# Patient Record
Sex: Male | Born: 1955 | Race: White | Hispanic: No | Marital: Married | Smoking: Never smoker
Health system: Southern US, Community
[De-identification: ages and names within clinical notes are randomized; demographics above are authoritative.]

## PROBLEM LIST (undated history)

## (undated) DIAGNOSIS — R1310 Dysphagia, unspecified: Secondary | ICD-10-CM

## (undated) DIAGNOSIS — K219 Gastro-esophageal reflux disease without esophagitis: Secondary | ICD-10-CM

## (undated) DIAGNOSIS — I10 Essential (primary) hypertension: Secondary | ICD-10-CM

## (undated) DIAGNOSIS — S32010A Wedge compression fracture of first lumbar vertebra, initial encounter for closed fracture: Secondary | ICD-10-CM

## (undated) DIAGNOSIS — Z974 Presence of external hearing-aid: Secondary | ICD-10-CM

## (undated) DIAGNOSIS — G4730 Sleep apnea, unspecified: Secondary | ICD-10-CM

## (undated) DIAGNOSIS — N419 Inflammatory disease of prostate, unspecified: Secondary | ICD-10-CM

## (undated) DIAGNOSIS — R55 Syncope and collapse: Secondary | ICD-10-CM

## (undated) DIAGNOSIS — G20 Parkinson's disease: Secondary | ICD-10-CM

## (undated) DIAGNOSIS — G20A1 Parkinson's disease without dyskinesia, without mention of fluctuations: Secondary | ICD-10-CM

## (undated) DIAGNOSIS — E119 Type 2 diabetes mellitus without complications: Secondary | ICD-10-CM

## (undated) HISTORY — PX: ESOPHAGOGASTRODUODENOSCOPY: SHX1529

## (undated) HISTORY — DX: Inflammatory disease of prostate, unspecified: N41.9

## (undated) HISTORY — DX: Type 2 diabetes mellitus without complications: E11.9

## (undated) HISTORY — DX: Wedge compression fracture of first lumbar vertebra, initial encounter for closed fracture: S32.010A

## (undated) HISTORY — DX: Dysphagia, unspecified: R13.10

## (undated) HISTORY — PX: COLONOSCOPY: SHX174

## (undated) HISTORY — DX: Gastro-esophageal reflux disease without esophagitis: K21.9

## (undated) HISTORY — DX: Syncope and collapse: R55

---

## 2004-05-13 HISTORY — PX: CARDIAC CATHETERIZATION: SHX172

## 2006-03-16 ENCOUNTER — Inpatient Hospital Stay: Payer: Self-pay | Admitting: Unknown Physician Specialty

## 2006-03-19 ENCOUNTER — Emergency Department: Payer: Self-pay | Admitting: Emergency Medicine

## 2006-05-04 ENCOUNTER — Ambulatory Visit: Payer: Self-pay | Admitting: Physician Assistant

## 2008-07-17 ENCOUNTER — Other Ambulatory Visit: Payer: Self-pay

## 2008-07-17 ENCOUNTER — Emergency Department: Payer: Self-pay

## 2009-03-31 ENCOUNTER — Emergency Department: Payer: Self-pay | Admitting: Emergency Medicine

## 2010-01-14 ENCOUNTER — Ambulatory Visit: Payer: Self-pay | Admitting: Gastroenterology

## 2010-04-23 IMAGING — CR DG CHEST 2V
1 series · 2 of 2 positions shown · non-contrast
Comparison: none

REASON FOR EXAM: sob with near syncope
COMMENTS:

[Series 1: view not recorded · 0.17mm/px · 2 of 2 slices shown]
[im 1/2]
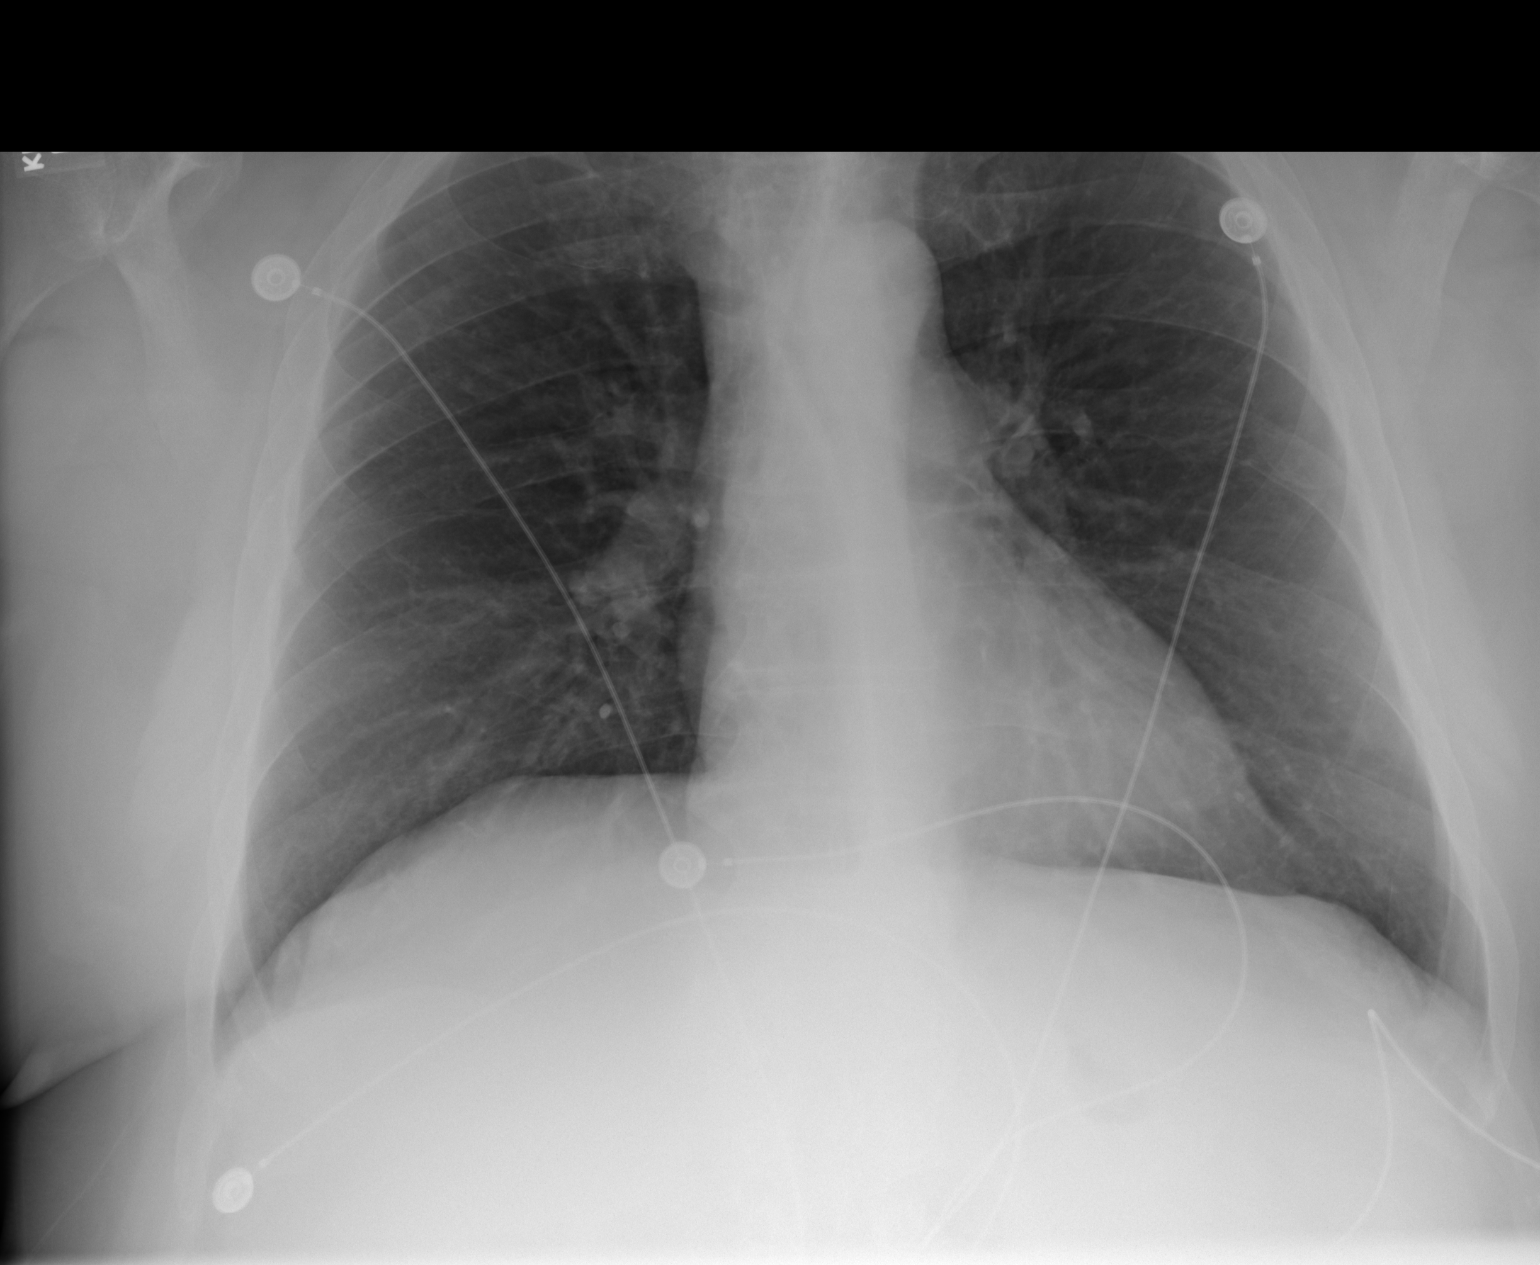
[im 2/2]
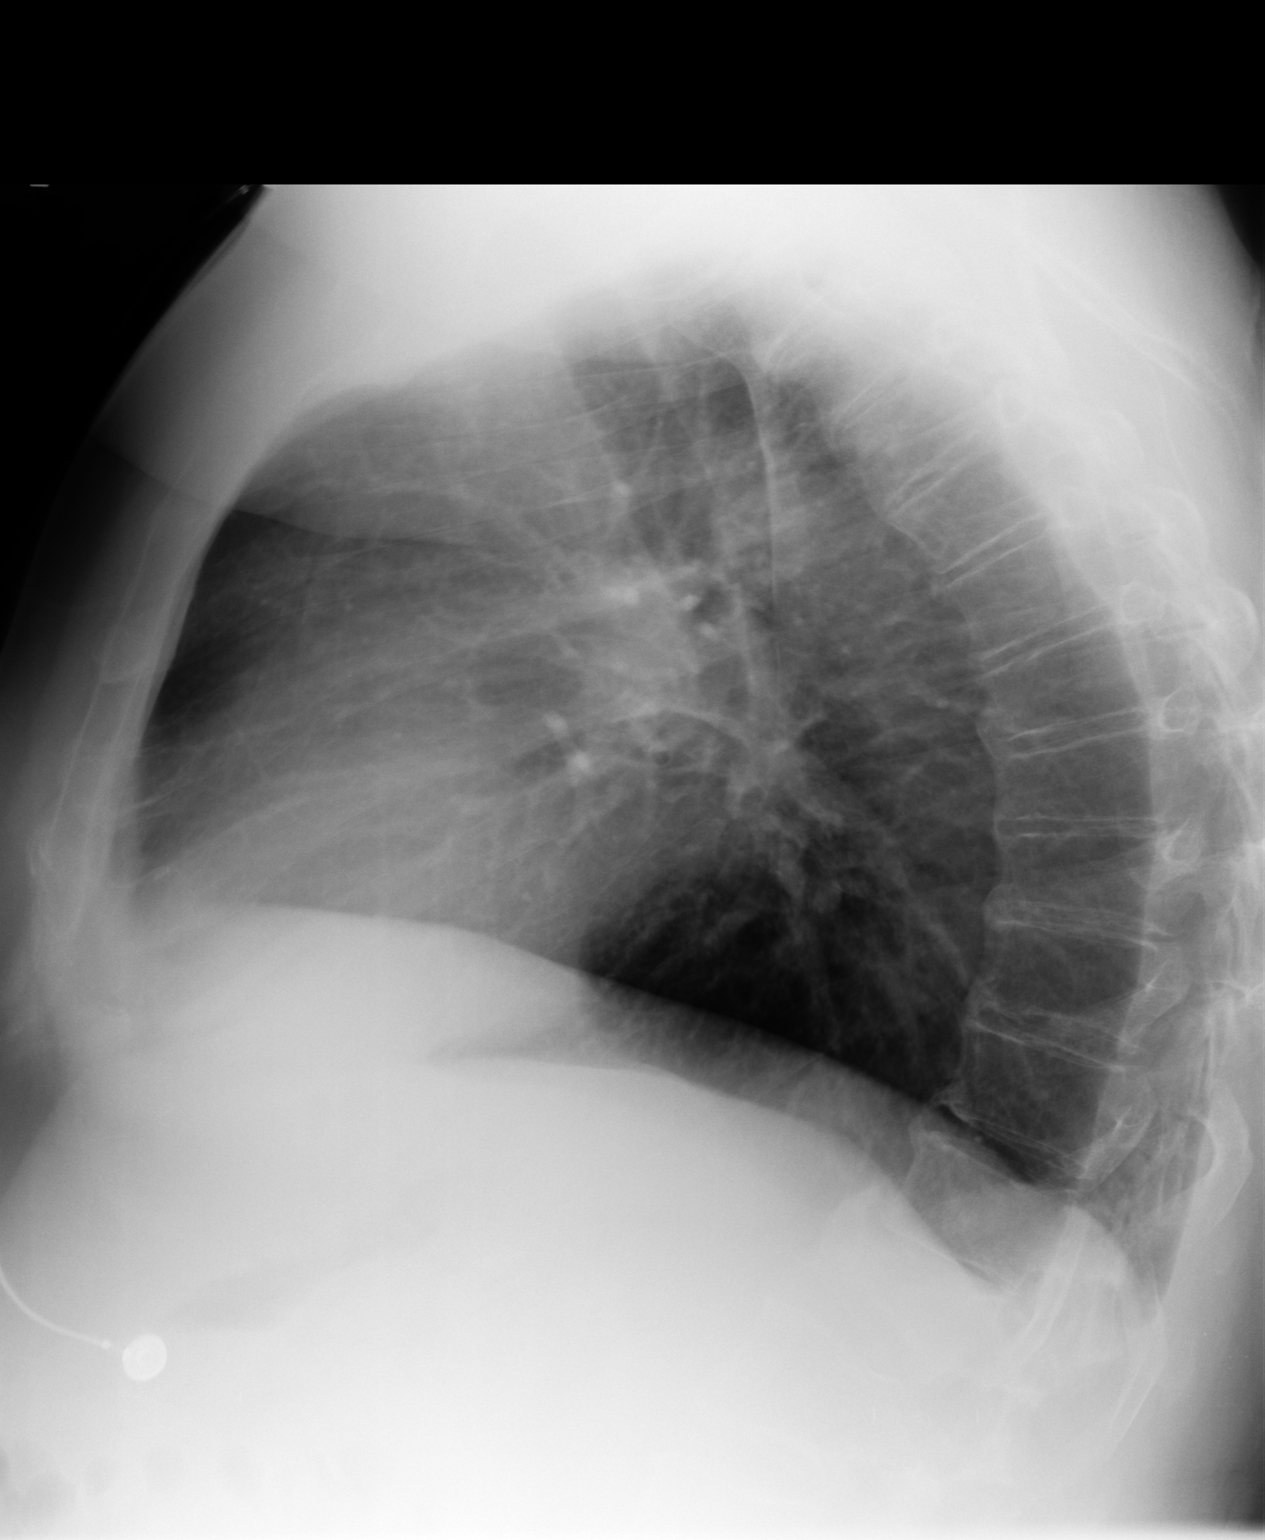

[2 of 2 positions shown; findings below may reference images not displayed]

PROCEDURE:     DXR - DXR CHEST PA (OR AP) AND LATERAL  - July 17, 2008  [DATE]

RESULT:     There is no previous exam for comparison. The lungs are
hyperinflated. Cardiac monitoring electrodes are present. The lungs are
clear. The heart and pulmonary vessels are normal. The bony and mediastinal
structures are unremarkable. There is no effusion. There is no pneumothorax
or evidence of congestive failure.
IMPRESSION: No acute cardiopulmonary disease. COPD changes are
suggested.

## 2013-03-22 ENCOUNTER — Encounter: Payer: Self-pay | Admitting: Orthopedic Surgery

## 2013-04-10 ENCOUNTER — Emergency Department: Payer: Self-pay | Admitting: Emergency Medicine

## 2013-04-10 LAB — COMPREHENSIVE METABOLIC PANEL
Alkaline Phosphatase: 62 U/L (ref 50–136)
Anion Gap: 6 — ABNORMAL LOW (ref 7–16)
BUN: 11 mg/dL (ref 7–18)
Bilirubin,Total: 0.9 mg/dL (ref 0.2–1.0)
Chloride: 110 mmol/L — ABNORMAL HIGH (ref 98–107)
Co2: 26 mmol/L (ref 21–32)
Glucose: 144 mg/dL — ABNORMAL HIGH (ref 65–99)
Osmolality: 285 (ref 275–301)
SGOT(AST): 18 U/L (ref 15–37)
SGPT (ALT): 40 U/L (ref 12–78)
Total Protein: 6.7 g/dL (ref 6.4–8.2)

## 2013-04-10 LAB — CBC
MCV: 82 fL (ref 80–100)
Platelet: 186 10*3/uL (ref 150–440)
RBC: 5.18 10*6/uL (ref 4.40–5.90)
RDW: 13.8 % (ref 11.5–14.5)
WBC: 10.6 10*3/uL (ref 3.8–10.6)

## 2013-04-10 LAB — TROPONIN I: Troponin-I: <0.02 ng/mL

## 2013-04-10 LAB — PRO B NATRIURETIC PEPTIDE: B-Type Natriuretic Peptide: 81 pg/mL (ref 0–125)

## 2013-04-10 LAB — CK TOTAL AND CKMB (NOT AT ARMC): CK, Total: 124 U/L (ref 35–232)

## 2013-04-11 ENCOUNTER — Encounter: Payer: Self-pay | Admitting: *Deleted

## 2013-04-11 ENCOUNTER — Other Ambulatory Visit: Payer: Self-pay | Admitting: *Deleted

## 2013-04-11 ENCOUNTER — Encounter: Payer: Self-pay | Admitting: Cardiovascular Disease

## 2013-04-11 ENCOUNTER — Ambulatory Visit (INDEPENDENT_AMBULATORY_CARE_PROVIDER_SITE_OTHER): Payer: No Typology Code available for payment source | Admitting: Cardiovascular Disease

## 2013-04-11 VITALS — BP 118/74 | HR 71 | Ht 71.0 in | Wt 234.0 lb

## 2013-04-11 DIAGNOSIS — R0602 Shortness of breath: Secondary | ICD-10-CM

## 2013-04-11 DIAGNOSIS — R0789 Other chest pain: Secondary | ICD-10-CM

## 2013-04-11 MED ORDER — OMEPRAZOLE 20 MG PO CPDR: 20.0000 mg | DELAYED_RELEASE_CAPSULE | Freq: Two times a day (BID) | ORAL | Status: AC

## 2013-04-11 NOTE — Assessment & Plan Note (Signed)
He has been having intermittent episodes of chest pain mostly at rest which did not worsen with physical activities. The symptoms are atypical for angina and will likely to be related to his known history of GERD. Nonetheless, he has risk factors for coronary artery disease including prolonged history of diabetes .  His physical exam is unremarkable and baseline EKG is normal. I recommend further risk stratification with a treadmill stress test which will be done today. If this comes back normal, he might require further GI evaluation.

## 2013-04-11 NOTE — Procedures (Signed)
    Treadmill Stress test  Indication: Atypical chest pain.  Baseline Data:  Resting EKG shows NSR with rate of 82 bpm, no significant ST or T wave changes. Frequent PACs noted. Lead misplacement was noted. Resting blood pressure of 118/74 mm Hg Stand bruce protocal was used.  Exercise Data:  Patient exercised for 8 min 30 sec,  Peak heart rate of 158 bpm.  This was 97 % of the maximum predicted heart rate. No symptoms of chest pain or lightheadedness were reported at peak stress or in recovery.  Peak Blood pressure recorded was 190/84 Maximal work level: 10.1 METs.  Heart rate at 3 minutes in recovery was 88 bpm. BP response: Hypertensive HR response: Normal.  EKG with Exercise: Sinus tachycardia with no significant ST changes. PACs noted in recovery.  FINAL IMPRESSION: Normal exercise stress test. No significant EKG changes concerning for ischemia. Good exercise tolerance.  Recommendation: His chest pain is likely noncardiac and most likely due to GERD. I advised him to followup with his primary care physician to see of referral to GI is indicated.

## 2013-04-11 NOTE — Patient Instructions (Addendum)
Your physician has requested that you have an exercise tolerance test. For further information please visit [email or web address]. Please also follow instruction sheet, as given.   

## 2013-04-11 NOTE — Patient Instructions (Addendum)
Your stress test is normal.  Change Omeprazole to 20 mg twice daily.  Follow up with Dr. Hyacinth Meeker.

## 2013-04-11 NOTE — Progress Notes (Signed)
Primary care physician: Bethann Punches, M.D.  HPI  This is a pleasant 57 year old male who is here today for evaluation of chest pain. He has no previous cardiac history. He underwent cardiac catheterization in 2005 after an abnormal stress test. Catheterization showed normal coronary arteries with normal LV systolic function. He has known history of type 2 diabetes and hyperlipidemia. He suffers from chronic back pain related to previous occupational injury. He does have known history of GERD with previous esophageal dilatation done by Dr. Bluford Kaufmann according to the patient. He has been having intermittent episodes of substernal chest tightness which happens at rest and does not worsen with physical activities. It lasts for a few minutes and usually it is mild. This worsened over the weekend and thus he went to the emergency room at [facility]. EKG showed no acute changes. His labs were normal including cardiac enzymes. Chest x-ray showed no acute abnormalities.  Allergies  Allergen Reactions  . Levaquin (Levofloxacin In D5w)      No current outpatient prescriptions on file prior to visit.   No current facility-administered medications on file prior to visit.     Past Medical History  Diagnosis Date  . GERD (gastroesophageal reflux disease)   . Prostatitis   . Compression fracture of L1 lumbar vertebra   . Dysphagia, unspecified(787.20)   . Syncope and collapse   . Diabetes mellitus without complication     Type II     Past Surgical History  Procedure Laterality Date  . Cardiac catheterization  05/2004    [facility]     History reviewed. No pertinent family history.   History   Social History  . Marital Status: Married    Spouse Name: N/A    Number of Children: N/A  . Years of Education: N/A   Occupational History  . Not on file.   Social History Main Topics  . Smoking status: Never Smoker   . Smokeless tobacco: Not on file  . Alcohol Use: No  . Drug Use: No     Comment: past    . Sexually Active: Not on file   Other Topics Concern  . Not on file   Social History Narrative  . No narrative on file     ROS Constitutional: Negative for fever, chills, diaphoresis, activity change, appetite change and fatigue.  HENT: Negative for hearing loss, nosebleeds, congestion, sore throat, facial swelling, drooling, trouble swallowing, neck pain, voice change, sinus pressure and tinnitus.  Eyes: Negative for photophobia, pain, discharge and visual disturbance.  Respiratory: Negative for apnea, cough,  shortness of breath and wheezing.  Cardiovascular: Negative for palpitations and leg swelling.  Gastrointestinal: Negative for nausea, vomiting, abdominal pain, diarrhea, constipation, blood in stool and abdominal distention.  Genitourinary: Negative for dysuria, urgency, frequency, hematuria and decreased urine volume.  Musculoskeletal: Negative for myalgias, back pain, joint swelling, arthralgias and gait problem.  Skin: Negative for color change, pallor, rash and wound.  Neurological: Negative for dizziness, tremors, seizures, syncope, speech difficulty, weakness, light-headedness, numbness and headaches.  Psychiatric/Behavioral: Negative for suicidal ideas, hallucinations, behavioral problems and agitation. The patient is not nervous/anxious.     PHYSICAL EXAM   BP 118/74  Pulse 71  Ht 5\' 11"  (1.803 m)  Wt 234 lb (106.142 kg)  BMI 32.65 kg/m2 Constitutional: He is oriented to person, place, and time. He appears well-developed and well-nourished. No distress.  HENT: No nasal discharge.  Head: Normocephalic and atraumatic.  Eyes: Pupils are equal and round. Right eye exhibits  no discharge. Left eye exhibits no discharge.  Neck: Normal range of motion. Neck supple. No JVD present. No thyromegaly present.  Cardiovascular: Normal rate, regular rhythm, normal heart sounds and. Exam reveals no gallop and no friction rub. No murmur heard.  Pulmonary/Chest: Effort normal  and breath sounds normal. No stridor. No respiratory distress. He has no wheezes. He has no rales. He exhibits no tenderness.  Abdominal: Soft. Bowel sounds are normal. He exhibits no distension. There is no tenderness. There is no rebound and no guarding.  Musculoskeletal: Normal range of motion. He exhibits no edema and no tenderness.  Neurological: He is alert and oriented to person, place, and time. Coordination normal.  Skin: Skin is warm and dry. No rash noted. He is not diaphoretic. No erythema. No pallor.  Psychiatric: He has a normal mood and affect. His behavior is normal. Judgment and thought content normal.       EKG: Sinus  Rhythm  -RSR(V1) -nondiagnostic.   PROBABLY NORMAL   ASSESSMENT AND PLAN

## 2013-04-12 ENCOUNTER — Encounter: Payer: Self-pay | Admitting: Orthopedic Surgery

## 2013-04-21 ENCOUNTER — Encounter: Payer: Self-pay | Admitting: *Deleted

## 2013-05-13 ENCOUNTER — Encounter: Payer: Self-pay | Admitting: Orthopedic Surgery

## 2013-06-13 ENCOUNTER — Encounter: Payer: Self-pay | Admitting: Orthopedic Surgery

## 2013-11-18 ENCOUNTER — Emergency Department: Payer: Self-pay | Admitting: Emergency Medicine

## 2014-02-13 ENCOUNTER — Encounter (INDEPENDENT_AMBULATORY_CARE_PROVIDER_SITE_OTHER): Payer: Self-pay

## 2014-02-13 ENCOUNTER — Encounter: Payer: Self-pay | Admitting: *Deleted

## 2014-02-13 ENCOUNTER — Encounter: Payer: Self-pay | Admitting: Cardiovascular Disease

## 2014-02-13 ENCOUNTER — Ambulatory Visit (INDEPENDENT_AMBULATORY_CARE_PROVIDER_SITE_OTHER): Payer: No Typology Code available for payment source | Admitting: Cardiovascular Disease

## 2014-02-13 VITALS — BP 128/72 | HR 74 | Ht 71.0 in | Wt 229.8 lb

## 2014-02-13 DIAGNOSIS — R0609 Other forms of dyspnea: Secondary | ICD-10-CM

## 2014-02-13 DIAGNOSIS — R0600 Dyspnea, unspecified: Secondary | ICD-10-CM | POA: Insufficient documentation

## 2014-02-13 DIAGNOSIS — R002 Palpitations: Secondary | ICD-10-CM

## 2014-02-13 DIAGNOSIS — R0989 Other specified symptoms and signs involving the circulatory and respiratory systems: Secondary | ICD-10-CM

## 2014-02-13 NOTE — Patient Instructions (Signed)
Your physician has recommended that you wear a holter monitor. Holter monitors are medical devices that record the heart's electrical activity. Doctors most often use these monitors to diagnose arrhythmias. Arrhythmias are problems with the speed or rhythm of the heartbeat. The monitor is a small, portable device. You can wear one while you do your normal daily activities. This is usually used to diagnose what is causing palpitations/syncope (passing out).   Your physician has requested that you have an echocardiogram. Echocardiography is a painless test that uses sound waves to create images of your heart. It provides your doctor with information about the size and shape of your heart and how well your heart's chambers and valves are working. This procedure takes approximately one hour. There are no restrictions for this procedure.  Your physician recommends that you schedule a follow-up appointment in:  As needed   

## 2014-02-13 NOTE — Progress Notes (Signed)
Primary care physician: Emily Filbert, M.D.  HPI  This is a pleasant 58 year old male who is here today for a follow up visit and evaluation of palpitations. He has no previous cardiac history. He was seen last year for atypical chest pain. He had cardiac catheterization in 2005 after an abnormal stress test. Catheterization showed normal coronary arteries with normal LV systolic function. He has known history of type 2 diabetes and hyperlipidemia. He suffers from chronic back pain related to previous occupational injury. He does have known history of GERD with previous esophageal dilatation done by Dr. Candace Cruise according to the patient. He had a treadmill stress test done last year which was normal. Chest pain was felt to be due to GERD.   He has been having palpitations mostly at night. He complains of increased fatigue. He had an episode of dull aching sensation in his chest about 10 days ago. Blood pressure was checked by EMS and it was 150/70. EKG showed no acute abnormalities. His wife reports that he snores loud with previous episodes of apnea noted.  Allergies  Allergen Reactions  . Levaquin [Levofloxacin In D5w]      Current Outpatient Prescriptions on File Prior to Visit  Medication Sig Dispense Refill  . ALPRAZolam (XANAX) 0.25 MG tablet Take 0.25 mg by mouth at bedtime as needed for sleep.      Marland Kitchen aspirin 81 MG tablet Take 81 mg by mouth daily.      . cyclobenzaprine (FLEXERIL) 10 MG tablet Take 10 mg by mouth 2 (two) times daily as needed for muscle spasms.      Marland Kitchen losartan (COZAAR) 50 MG tablet Take 50 mg by mouth daily.      . metFORMIN (GLUCOPHAGE) 500 MG tablet Take 500 mg by mouth 2 (two) times daily with a meal.       . omeprazole (PRILOSEC) 20 MG capsule Take 1 capsule (20 mg total) by mouth 2 (two) times daily.  60 capsule  3   No current facility-administered medications on file prior to visit.     Past Medical History  Diagnosis Date  . GERD (gastroesophageal reflux  disease)   . Prostatitis   . Compression fracture of L1 lumbar vertebra   . Dysphagia, unspecified(787.20)   . Syncope and collapse   . Diabetes mellitus without complication     Type II     Past Surgical History  Procedure Laterality Date  . Cardiac catheterization  05/2004    [facility]     Family History  Problem Relation Age of Onset  . Family history unknown: Yes     History   Social History  . Marital Status: Married    Spouse Name: N/A    Number of Children: N/A  . Years of Education: N/A   Occupational History  . Not on file.   Social History Main Topics  . Smoking status: Never Smoker   . Smokeless tobacco: Not on file  . Alcohol Use: No  . Drug Use: No     Comment: past  . Sexual Activity: Not on file   Other Topics Concern  . Not on file   Social History Narrative  . No narrative on file        PHYSICAL EXAM   BP 128/72  Pulse 74  Ht 5\' 11"  (1.803 m)  Wt 229 lb 12 oz (104.214 kg)  BMI 32.06 kg/m2 Constitutional: He is oriented to person, place, and time. He appears well-developed and well-nourished.  No distress.  HENT: No nasal discharge.  Head: Normocephalic and atraumatic.  Eyes: Pupils are equal and round. Right eye exhibits no discharge. Left eye exhibits no discharge.  Neck: Normal range of motion. Neck supple. No JVD present. No thyromegaly present.  Cardiovascular: Normal rate, regular rhythm, normal heart sounds and. Exam reveals no gallop and no friction rub. No murmur heard.  Pulmonary/Chest: Effort normal and breath sounds normal. No stridor. No respiratory distress. He has no wheezes. He has no rales. He exhibits no tenderness.  Abdominal: Soft. Bowel sounds are normal. He exhibits no distension. There is no tenderness. There is no rebound and no guarding.  Musculoskeletal: Normal range of motion. He exhibits no edema and no tenderness.  Neurological: He is alert and oriented to person, place, and time. Coordination normal.  Skin:  Skin is warm and dry. No rash noted. He is not diaphoretic. No erythema. No pallor.  Psychiatric: He has a normal mood and affect. His behavior is normal. Judgment and thought content normal.       EKG: Normal sinus rhythm     ASSESSMENT AND PLAN

## 2014-02-13 NOTE — Assessment & Plan Note (Signed)
He complains of fatigue and occasional shortness of breath. He did have a treadmill stress test last year which was normal. I recommend an echocardiogram to evaluate LV systolic function. He also has symptoms suggestive of sleep apnea. Evaluation with a sleep study might be helpful given current symptoms.

## 2014-02-13 NOTE — Assessment & Plan Note (Signed)
He is having palpitations mostly at night. This could be due to premature beats. He had labs done in October of last year which overall was unremarkable including normal thyroid function. I requested a 48-hour Holter monitor for evaluation.

## 2014-02-20 DIAGNOSIS — R002 Palpitations: Secondary | ICD-10-CM

## 2014-02-21 ENCOUNTER — Other Ambulatory Visit: Payer: Self-pay

## 2014-02-21 ENCOUNTER — Other Ambulatory Visit (INDEPENDENT_AMBULATORY_CARE_PROVIDER_SITE_OTHER): Payer: No Typology Code available for payment source

## 2014-02-21 DIAGNOSIS — R0600 Dyspnea, unspecified: Secondary | ICD-10-CM

## 2014-02-21 DIAGNOSIS — R0602 Shortness of breath: Secondary | ICD-10-CM

## 2014-02-21 DIAGNOSIS — R079 Chest pain, unspecified: Secondary | ICD-10-CM

## 2014-03-02 ENCOUNTER — Telehealth: Payer: Self-pay

## 2014-03-02 NOTE — Telephone Encounter (Signed)
Pt wife left vm wanting a call to talk about pt medications.  Left msg to return my call.

## 2014-03-03 ENCOUNTER — Telehealth: Payer: Self-pay | Admitting: *Deleted

## 2014-03-03 NOTE — Telephone Encounter (Signed)
Pt would like copy of holter and echo mailed to him.

## 2014-03-03 NOTE — Telephone Encounter (Signed)
Called patient to let him know that per Dr. Fletcher Anon "his holter showed normal sinus rhythm with rare PVC's and occasioal PAC's. It is not serious and can be treated with a small dose of toprol if symptomatic"  Patient verbalized understanding.

## 2014-03-08 ENCOUNTER — Other Ambulatory Visit: Payer: Self-pay

## 2014-03-08 ENCOUNTER — Ambulatory Visit (INDEPENDENT_AMBULATORY_CARE_PROVIDER_SITE_OTHER): Payer: No Typology Code available for payment source | Admitting: *Deleted

## 2014-03-08 ENCOUNTER — Encounter: Payer: Self-pay | Admitting: Cardiovascular Disease

## 2014-03-08 DIAGNOSIS — R002 Palpitations: Secondary | ICD-10-CM

## 2014-03-14 ENCOUNTER — Telehealth: Payer: Self-pay

## 2014-03-14 NOTE — Telephone Encounter (Signed)
Pt wife called and would like to discuss if pt needs an appt, states she received a message through my chart, stating he needed an appt, also would like to discuss monitor results. Please call.

## 2014-03-14 NOTE — Telephone Encounter (Signed)
Informed patient that he can follow up needed  Mailed holter report

## 2014-10-20 ENCOUNTER — Ambulatory Visit: Payer: Self-pay | Admitting: Internal Medicine

## 2015-01-15 IMAGING — CR DG CHEST 2V
1 series · 3 of 3 positions shown · non-contrast
Comparison: none

REASON FOR EXAM: chest pain
COMMENTS:   May transport without cardiac monitor

PROCEDURE:     DXR - DXR CHEST PA (OR AP) AND LATERAL  - April 10, 2013  [DATE]
RESULT:     Comparison: 03/31/2009, 07/17/2008

[Series 1: w chest pa · 0.14mm/px · 3 of 3 slices shown]
[im 1/3]
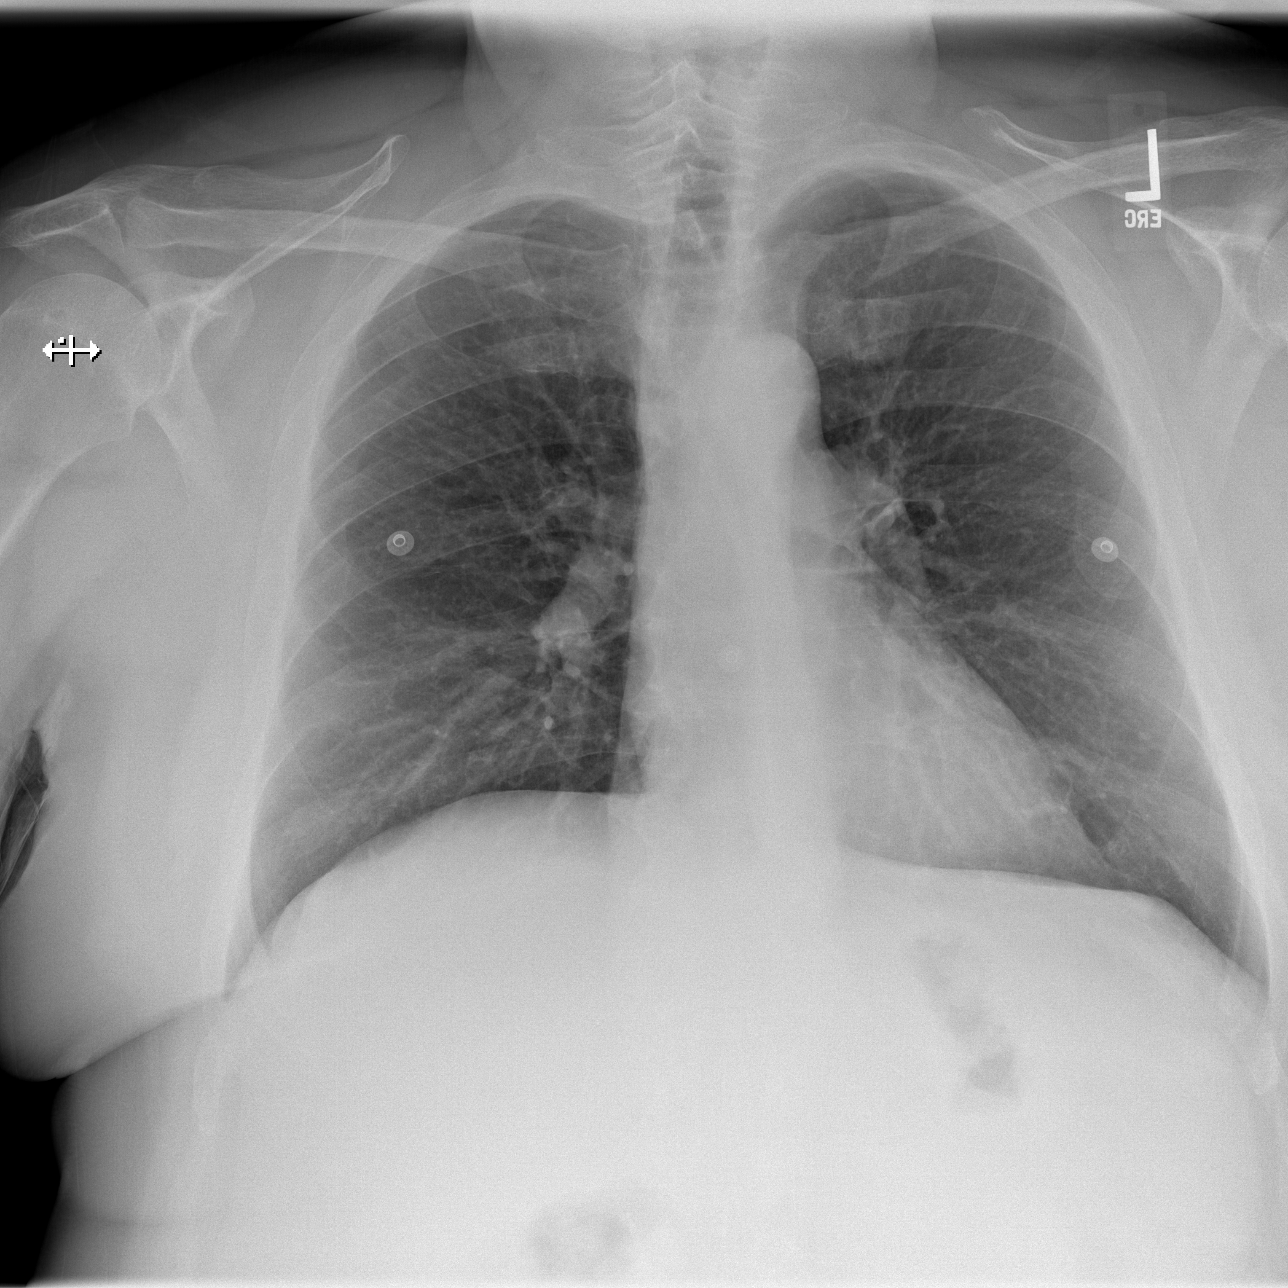
[im 2/3]
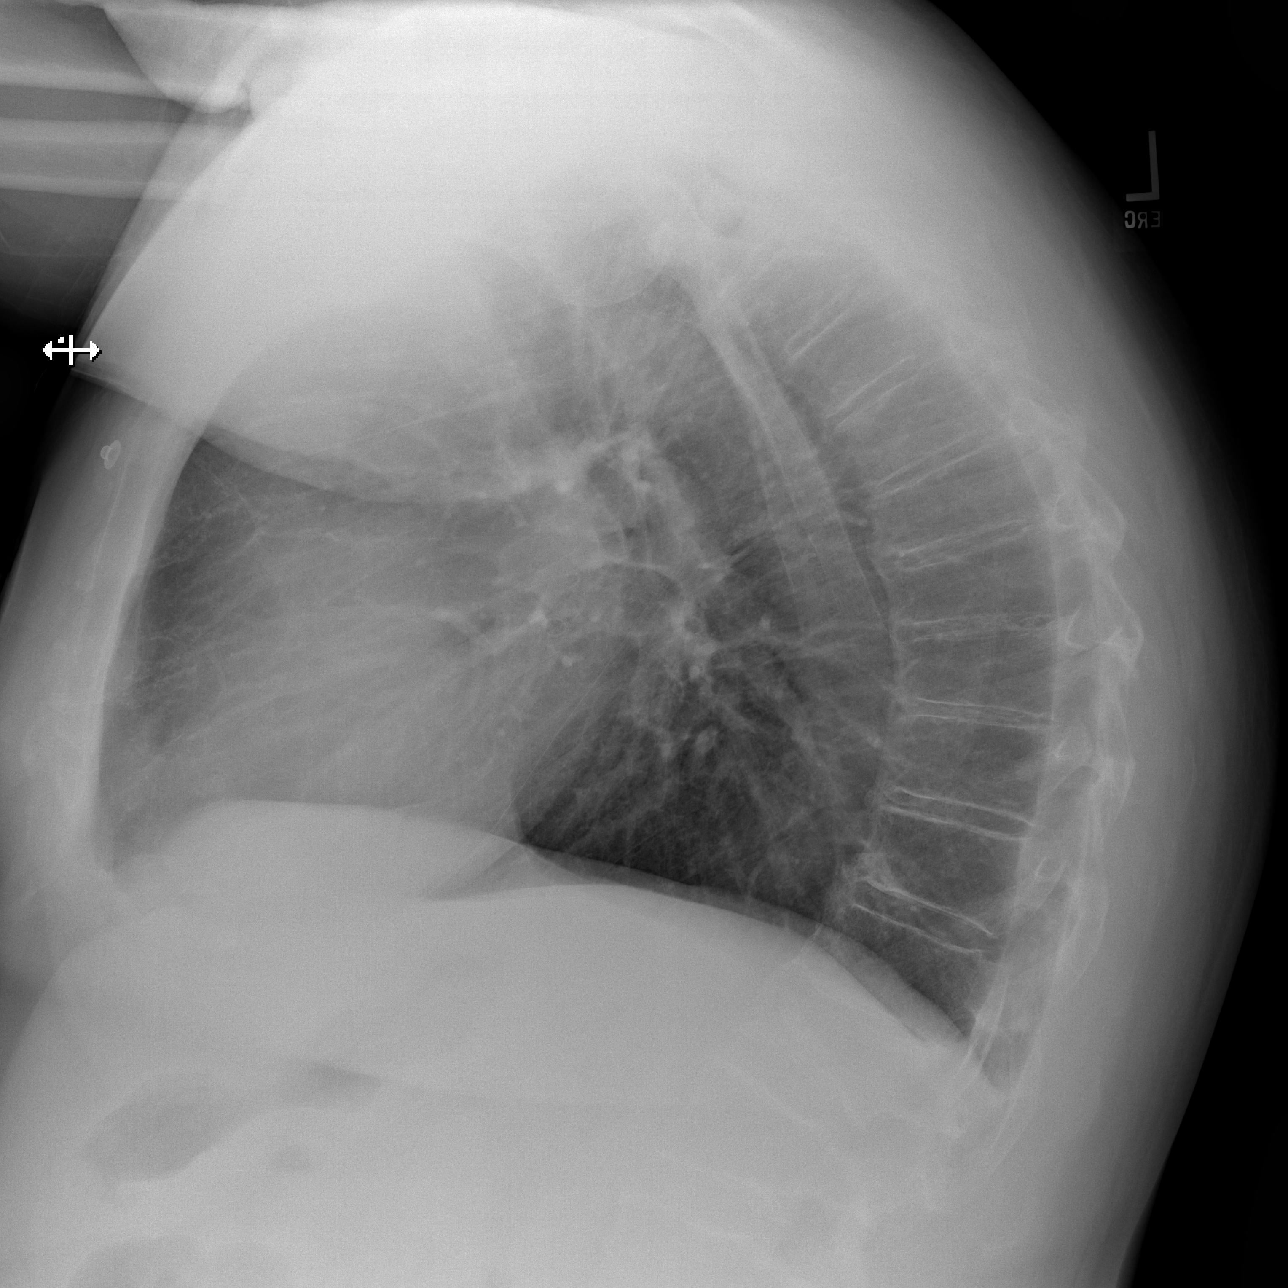
[im 3/3]
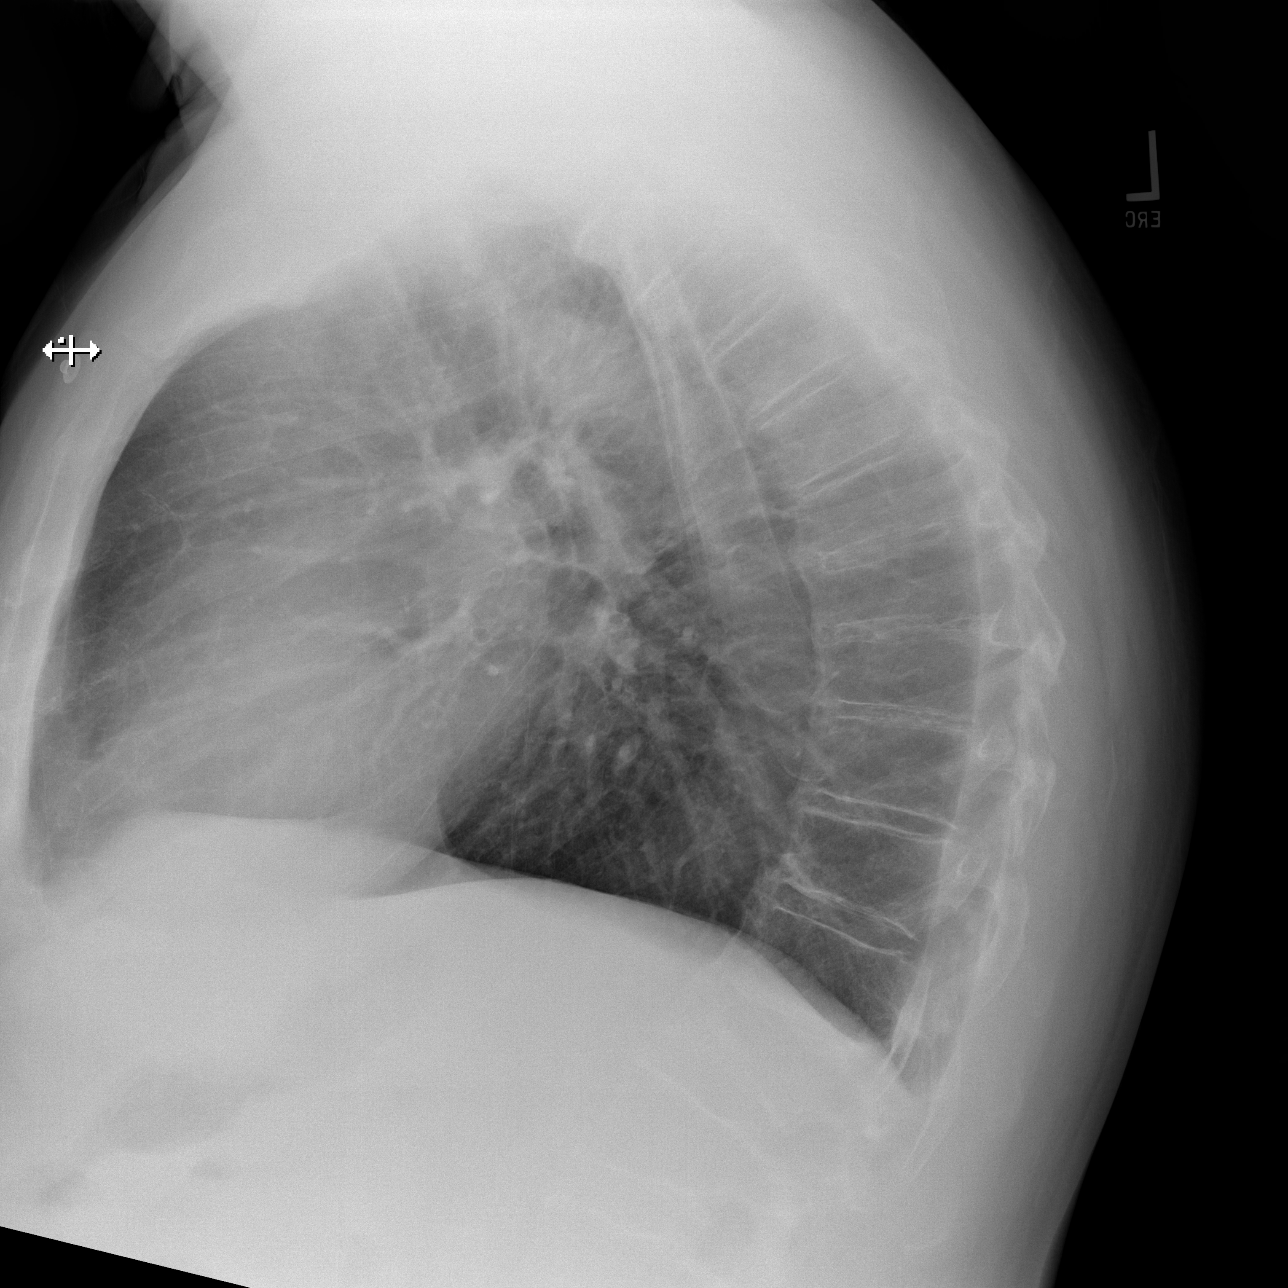

[3 of 3 positions shown; findings below may reference images not displayed]

FINDINGS: The heart and mediastinum are stable. No focal pulmonary opacities. There is
borderline hyperinflation. There is a moderate anterior wedge compression
deformity in the lower thoracic or upper lumbar spine, similar to prior.
IMPRESSION: No acute cardiopulmonary disease.

[REDACTED]

## 2015-08-25 IMAGING — CR DG LUMBAR SPINE 2-3V
1 series · 3 of 3 positions shown · non-contrast
Comparison: CT L SPINE W/O CM dated 03/16/2006

CLINICAL DATA: Back pain.  History of prior L1 fracture.

EXAM:
LUMBAR SPINE - 2-3 VIEW

[Series 2: t lumbar spine ap · 0.14mm/px · 3 of 3 slices shown]
[im 1/3]
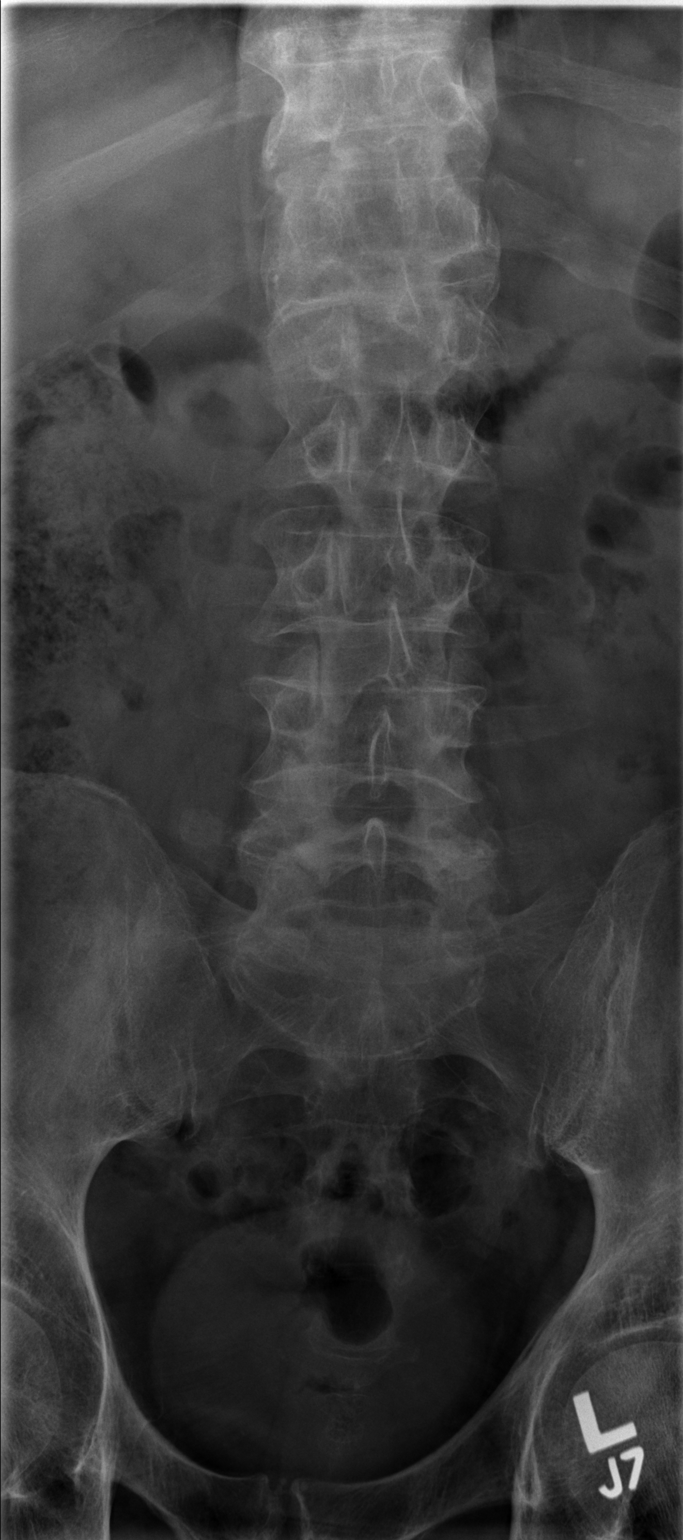
[im 2/3]
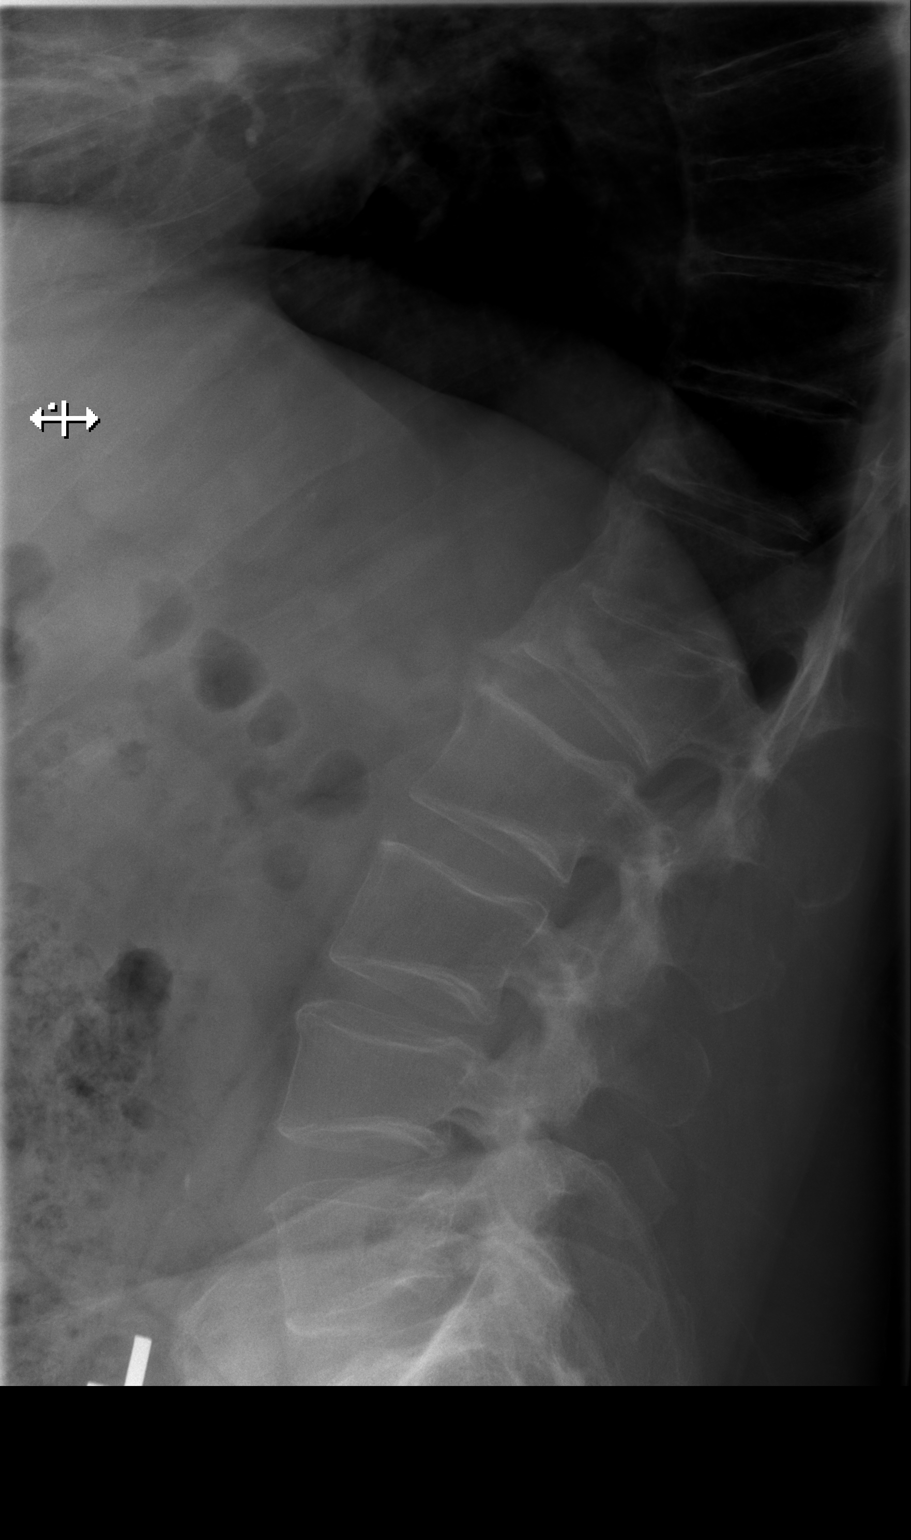
[im 3/3]
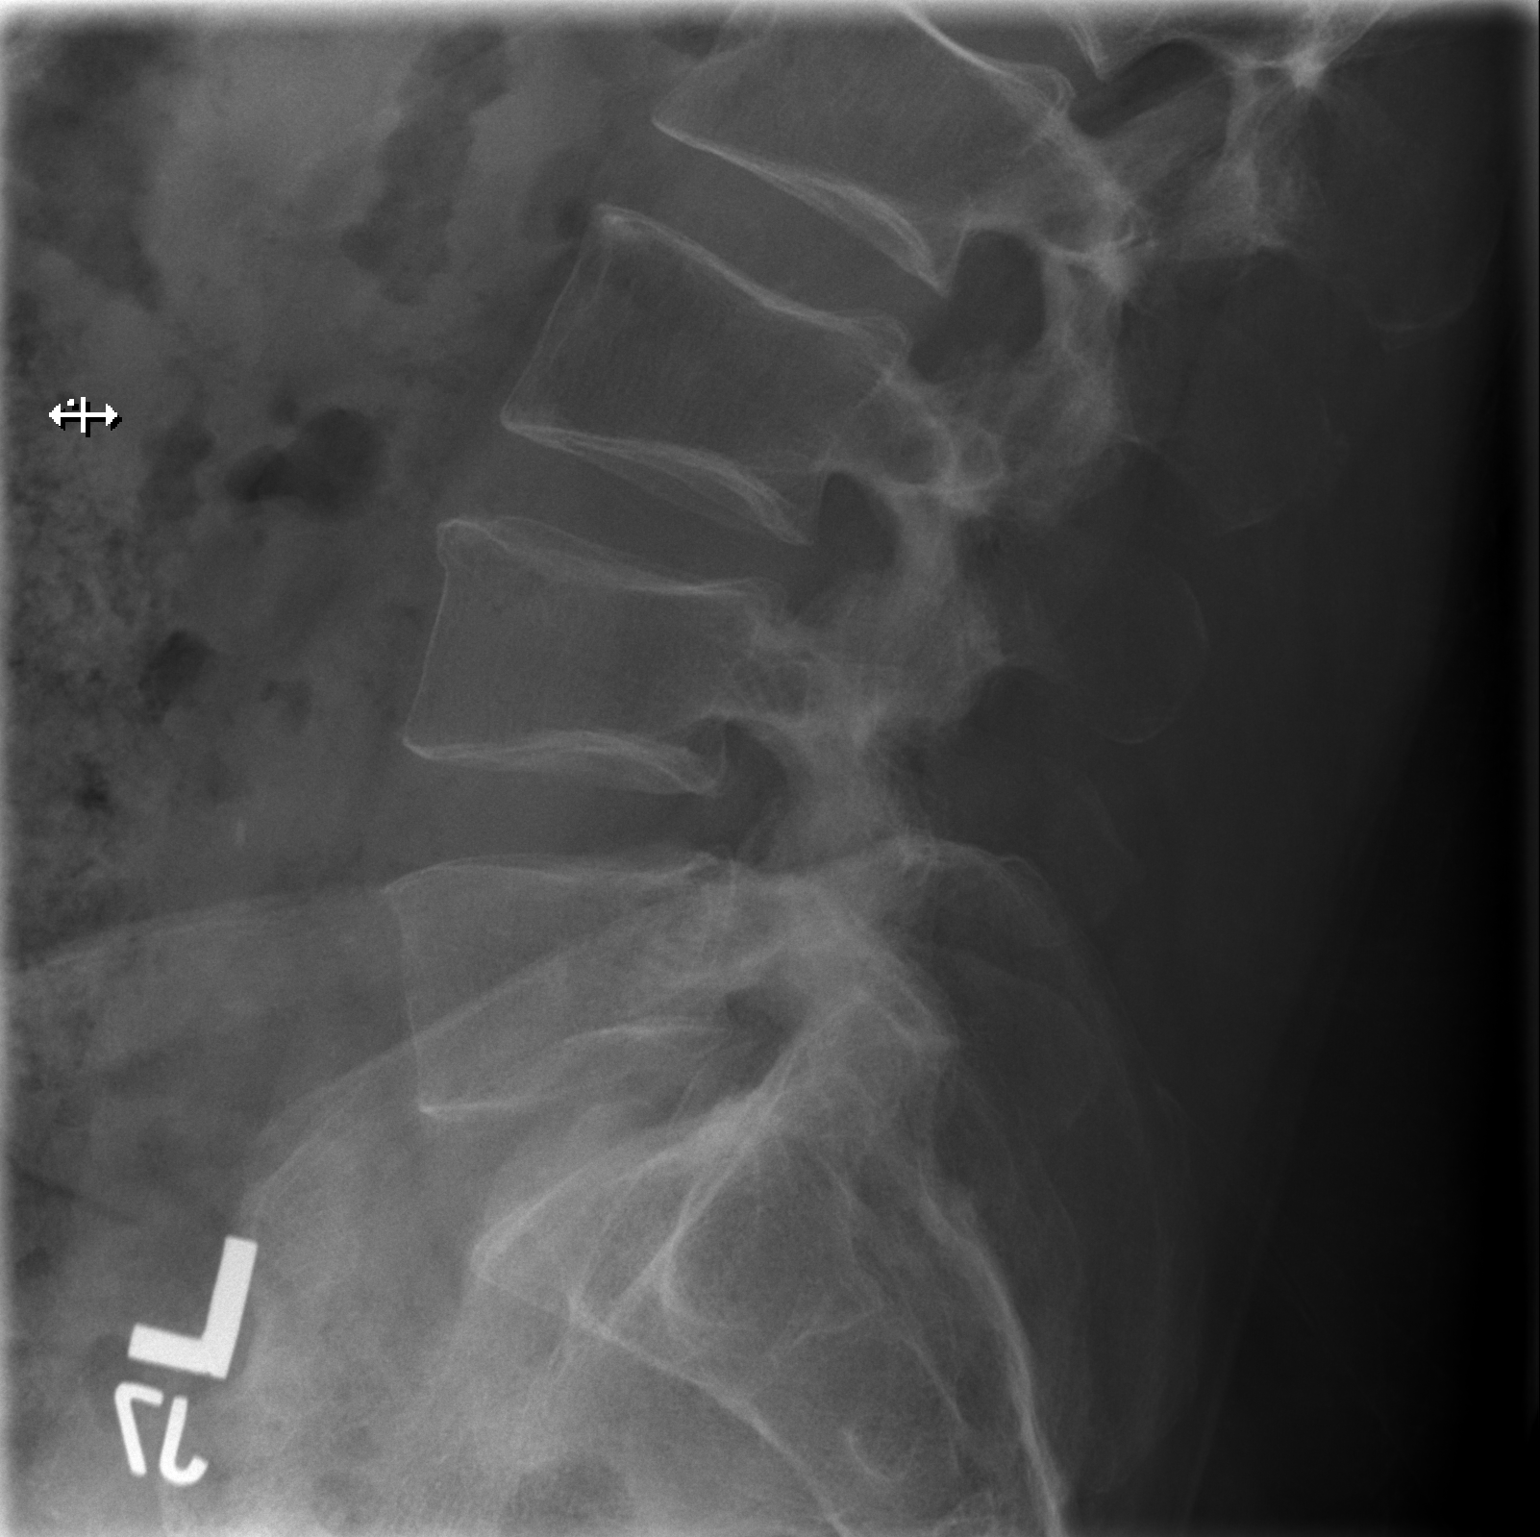

[3 of 3 positions shown; findings below may reference images not displayed]

FINDINGS: A chronic L1 compression fracture is appreciated. The bones are
osteopenic. There is not appear to be evidence of acute fracture or
dislocation. Endplate sclerosis is identified as well as mild disc
space narrowing at the L5-S1 level. Multilevel areas of central
endplate depression identified at throughout the mid and lower
lumbar spine.
IMPRESSION: Chronic L1 compression deformity by evidence of acute osseous
abnormalities. Spondylosis at the L5-S1 level.

## 2021-01-17 ENCOUNTER — Other Ambulatory Visit: Payer: Self-pay

## 2021-01-17 ENCOUNTER — Encounter: Payer: Self-pay | Admitting: Ophthalmology

## 2021-01-23 NOTE — Discharge Instructions (Signed)
INSTRUCTIONS FOLLOWING OCULOPLASTIC SURGERY AMY M. FOWLER, MD  AFTER YOUR EYE SURGERY, THER ARE MANY THINGS WHICH YOU, THE PATIENT, CAN DO TO ASSURE THE BEST POSSIBLE RESULT FROM YOUR OPERATION.  THIS SHEET SHOULD BE REFERRED TO WHENEVER QUESTIONS ARISE.  IF THERE ARE ANY QUESTIONS NOT ANSWERED HERE, DO NOT HESITATE TO CALL OUR OFFICE AT [phone] OR [phone].  THERE IS ALWAYS SOMEONE AVAILABLE TO CALL IF QUESTIONS OR PROBLEMS ARISE.  VISION: Your vision may be blurred and out of focus after surgery until you are able to stop using your ointment, swelling resolves and your eye(s) heal. This may take 1 to 2 weeks at the least.  If your vision becomes gradually more dim or dark, this is not normal and you need to call our office immediately.  EYE CARE: For the first 48 hours after surgery, use ice packs frequently - "20 minutes on, 20 minutes off" - to help reduce swelling and bruising.  Small bags of frozen peas or corn make good ice packs along with cloths soaked in ice water.  If you are wearing a patch or other type of dressing following surgery, keep this on for the amount of time specified by your doctor.  For the first week following surgery, you will need to treat your stitches with great care.  It is OK to shower, but take care to not allow soapy water to run into your eye(s) to help reduce chances of infection.  You may gently clean the eyelashes and around the eye(s) with cotton balls and sterile water, BUT DO NOT RUB THE STITCHES VIGOROUSLY.  Keeping your stitches moist with ointment will help promote healing with minimal scar formation.  ACTIVITY: When you leave the surgery center, you should go home, rest and be inactive.  The eye(s) may feel scratchy and keeping the eyes closed will allow for faster healing.  The first week following surgery, avoid straining (anything making the face turn red) or lifting over 20 pounds.  Additionally, avoid bending which causes your head to go below  your waist.  Using your eyes will NOT harm them, so feel free to read, watch television, use the computer, etc as desired.  Driving depends on each individual, so check with your doctor if you have questions about driving. Do not wear contact lenses for about 2 weeks.  Do not wear eye makeup for 2 weeks.  Avoid swimming, hot tubs, gardening, and dusting for 1 to 2 weeks to reduce the risk of an infection.  MEDICATIONS:  You will be given a prescription for an ointment to use 4 times a day on your stitches.  You can use the ointment in your eyes if they feel scratchy or irritated.  If you eyelid(s) don't close completely when you sleep, put some ointment in your eyes before bedtime.  EMERGENCY: If you experience SEVERE EYE PAIN OR HEADACHE UNRELIEVED BY TYLENOL OR TRAMADOL, NAUSEA OR VOMITING, WORSENING REDNESS, OR WORSENING VISION (ESPECIALLY VISION THAT WAS INITIALLY BETTER) CALL [phone] OR [phone] DURING BUSINESS HOURS OR AFTER HOURS. General Anesthesia, Adult, Care After This sheet gives you information about how to care for yourself after your procedure. Your health care provider may also give you more specific instructions. If you have problems or questions, contact your health care provider. What can I expect after the procedure? After the procedure, the following side effects are common:  Pain or discomfort at the IV site.  Nausea.  Vomiting.  Sore throat.  Trouble concentrating.  Feeling   cold or chills.  Feeling weak or tired.  Sleepiness and fatigue.  Soreness and body aches. These side effects can affect parts of the body that were not involved in surgery. Follow these instructions at home: For the time period you were told by your health care provider:  Rest.  Do not participate in activities where you could fall or become injured.  Do not drive or use machinery.  Do not drink alcohol.  Do not take sleeping pills or medicines that cause drowsiness.  Do  not make important decisions or sign legal documents.  Do not take care of children on your own.   Eating and drinking  Follow any instructions from your health care provider about eating or drinking restrictions.  When you feel hungry, start by eating small amounts of foods that are soft and easy to digest (bland), such as toast. Gradually return to your regular diet.  Drink enough fluid to keep your urine pale yellow.  If you vomit, rehydrate by drinking water, juice, or clear broth. General instructions  If you have sleep apnea, surgery and certain medicines can increase your risk for breathing problems. Follow instructions from your health care provider about wearing your sleep device: ? Anytime you are sleeping, including during daytime naps. ? While taking prescription pain medicines, sleeping medicines, or medicines that make you drowsy.  Have a responsible adult stay with you for the time you are told. It is important to have someone help care for you until you are awake and alert.  Return to your normal activities as told by your health care provider. Ask your health care provider what activities are safe for you.  Take over-the-counter and prescription medicines only as told by your health care provider.  If you smoke, do not smoke without supervision.  Keep all follow-up visits as told by your health care provider. This is important. Contact a health care provider if:  You have nausea or vomiting that does not get better with medicine.  You cannot eat or drink without vomiting.  You have pain that does not get better with medicine.  You are unable to pass urine.  You develop a skin rash.  You have a fever.  You have redness around your IV site that gets worse. Get help right away if:  You have difficulty breathing.  You have chest pain.  You have blood in your urine or stool, or you vomit blood. Summary  After the procedure, it is common to have a sore  throat or nausea. It is also common to feel tired.  Have a responsible adult stay with you for the time you are told. It is important to have someone help care for you until you are awake and alert.  When you feel hungry, start by eating small amounts of foods that are soft and easy to digest (bland), such as toast. Gradually return to your regular diet.  Drink enough fluid to keep your urine pale yellow.  Return to your normal activities as told by your health care provider. Ask your health care provider what activities are safe for you. This information is not intended to replace advice given to you by your health care provider. Make sure you discuss any questions you have with your health care provider. Document Revised: 06/14/2020 Document Reviewed: 01/12/2020 Elsevier Patient Education  2021 [employer]  

## 2021-01-25 ENCOUNTER — Encounter: Payer: Self-pay | Admitting: Ophthalmology

## 2021-01-25 ENCOUNTER — Ambulatory Visit: Payer: Medicare PPO | Admitting: Anesthesiology

## 2021-01-25 ENCOUNTER — Encounter
Admission: RE | Disposition: A | Discharge status: Home / Self Care | Payer: Self-pay | Source: Home / Self Care | Attending: Ophthalmology

## 2021-01-25 ENCOUNTER — Other Ambulatory Visit: Payer: Self-pay

## 2021-01-25 ENCOUNTER — Ambulatory Visit
Admission: RE | Admit: 2021-01-25 | Discharge: 2021-01-25 | Disposition: A | Discharge status: Home / Self Care | Payer: Medicare PPO | Attending: Ophthalmology | Admitting: Ophthalmology

## 2021-01-25 DIAGNOSIS — H57813 Brow ptosis, bilateral: Secondary | ICD-10-CM | POA: Insufficient documentation

## 2021-01-25 DIAGNOSIS — Z791 Long term (current) use of non-steroidal anti-inflammatories (NSAID): Secondary | ICD-10-CM | POA: Insufficient documentation

## 2021-01-25 DIAGNOSIS — E119 Type 2 diabetes mellitus without complications: Secondary | ICD-10-CM | POA: Diagnosis not present

## 2021-01-25 DIAGNOSIS — Z881 Allergy status to other antibiotic agents status: Secondary | ICD-10-CM | POA: Diagnosis not present

## 2021-01-25 DIAGNOSIS — Z7984 Long term (current) use of oral hypoglycemic drugs: Secondary | ICD-10-CM | POA: Insufficient documentation

## 2021-01-25 DIAGNOSIS — Z79899 Other long term (current) drug therapy: Secondary | ICD-10-CM | POA: Diagnosis not present

## 2021-01-25 DIAGNOSIS — H02831 Dermatochalasis of right upper eyelid: Secondary | ICD-10-CM | POA: Insufficient documentation

## 2021-01-25 DIAGNOSIS — H02834 Dermatochalasis of left upper eyelid: Secondary | ICD-10-CM | POA: Diagnosis not present

## 2021-01-25 DIAGNOSIS — L574 Cutis laxa senilis: Secondary | ICD-10-CM | POA: Insufficient documentation

## 2021-01-25 HISTORY — DX: Parkinson's disease: G20

## 2021-01-25 HISTORY — DX: Sleep apnea, unspecified: G47.30

## 2021-01-25 HISTORY — DX: Presence of external hearing-aid: Z97.4

## 2021-01-25 HISTORY — PX: BROW LIFT: SHX178

## 2021-01-25 HISTORY — DX: Essential (primary) hypertension: I10

## 2021-01-25 HISTORY — DX: Parkinson's disease without dyskinesia, without mention of fluctuations: G20.A1

## 2021-01-25 LAB — GLUCOSE, CAPILLARY: Glucose-Capillary: 141 mg/dL — ABNORMAL HIGH (ref 70–99)

## 2021-01-25 SURGERY — BLEPHAROPLASTY
Anesthesia: General | Site: Eye | Laterality: Bilateral

## 2021-01-25 MED ORDER — LACTATED RINGERS IV SOLN: INTRAVENOUS | Status: DC

## 2021-01-25 MED ORDER — ERYTHROMYCIN 5 MG/GM OP OINT: TOPICAL_OINTMENT | OPHTHALMIC | 2 refills | Status: AC

## 2021-01-25 MED ORDER — PROPOFOL 500 MG/50ML IV EMUL
INTRAVENOUS | Status: DC | PRN
  Administered 2021-01-25: 50 ug/kg/min via INTRAVENOUS

## 2021-01-25 MED ORDER — LIDOCAINE-EPINEPHRINE 2 %-1:100000 IJ SOLN
INTRAMUSCULAR | Status: DC | PRN
  Administered 2021-01-25: 9 mL via OPHTHALMIC

## 2021-01-25 MED ORDER — ERYTHROMYCIN 5 MG/GM OP OINT
TOPICAL_OINTMENT | OPHTHALMIC | Status: DC | PRN
  Administered 2021-01-25: 1 via OPHTHALMIC

## 2021-01-25 MED ORDER — TETRACAINE HCL 0.5 % OP SOLN
OPHTHALMIC | Status: DC | PRN
  Administered 2021-01-25: 2 [drp] via OPHTHALMIC

## 2021-01-25 MED ORDER — BSS IO SOLN
INTRAOCULAR | Status: DC | PRN
  Administered 2021-01-25: 15 mL via INTRAOCULAR

## 2021-01-25 MED ORDER — TRAMADOL HCL 50 MG PO TABS: ORAL_TABLET | ORAL | 0 refills | Status: AC

## 2021-01-25 MED ORDER — MIDAZOLAM HCL 2 MG/2ML IJ SOLN
INTRAMUSCULAR | Status: DC | PRN
  Administered 2021-01-25 (×2): 1 mg via INTRAVENOUS

## 2021-01-25 MED ORDER — ALFENTANIL 500 MCG/ML IJ INJ
INJECTION | INTRAVENOUS | Status: DC | PRN
  Administered 2021-01-25: 250 ug via INTRAVENOUS
  Administered 2021-01-25: 500 ug via INTRAVENOUS
  Administered 2021-01-25: 250 ug via INTRAVENOUS

## 2021-01-25 MED ORDER — STERILE WATER FOR IRRIGATION IR SOLN
Status: DC | PRN
  Administered 2021-01-25: 500 mL

## 2021-01-25 SURGICAL SUPPLY — 35 items
APPLICATOR COTTON TIP WD 3 STR (MISCELLANEOUS) ×2 IMPLANT
BLADE SURG 15 STRL LF DISP TIS (BLADE) ×2 IMPLANT
BLADE SURG 15 STRL SS (BLADE) ×4
CORD BIP STRL DISP 12FT (MISCELLANEOUS) ×2 IMPLANT
GAUZE SPONGE 4X4 12PLY STRL (GAUZE/BANDAGES/DRESSINGS) ×2 IMPLANT
GLOVE SURG LX 7.0 MICRO (GLOVE) ×2
GLOVE SURG LX STRL 7.0 MICRO (GLOVE) ×2 IMPLANT
GOWN STRL REUS W/ TWL LRG LVL3 (GOWN DISPOSABLE) ×1 IMPLANT
GOWN STRL REUS W/TWL LRG LVL3 (GOWN DISPOSABLE) ×2
MARKER SKIN XFINE TIP W/RULER (MISCELLANEOUS) ×2 IMPLANT
NEEDLE FILTER BLUNT 18X 1/2SAF (NEEDLE) ×1
NEEDLE FILTER BLUNT 18X1 1/2 (NEEDLE) ×1 IMPLANT
NEEDLE HYPO 30X.5 LL (NEEDLE) ×4 IMPLANT
PACK ENT CUSTOM (PACKS) ×2 IMPLANT
SOL PREP PVP 2OZ (MISCELLANEOUS) ×2
SOLUTION PREP PVP 2OZ (MISCELLANEOUS) ×1 IMPLANT
SPONGE GAUZE 2X2 8PLY STRL LF (GAUZE/BANDAGES/DRESSINGS) ×20 IMPLANT
SUT CHROMIC 4-0 (SUTURE)
SUT CHROMIC 4-0 M2 12X2 ARM (SUTURE)
SUT CHROMIC 5 0 P 3 (SUTURE) ×4 IMPLANT
SUT ETHILON 4 0 CL P 3 (SUTURE) IMPLANT
SUT GUT PLAIN 6-0 1X18 ABS (SUTURE) ×2 IMPLANT
SUT MERSILENE 4-0 S-2 (SUTURE) IMPLANT
SUT PROLENE 5 0 P 3 (SUTURE) ×4 IMPLANT
SUT PROLENE 6 0 P 1 18 (SUTURE) IMPLANT
SUT SILK 4 0 G 3 (SUTURE) IMPLANT
SUT VIC AB 5-0 P-3 18X BRD (SUTURE) IMPLANT
SUT VIC AB 5-0 P3 18 (SUTURE)
SUT VICRYL 6-0  S14 CTD (SUTURE)
SUT VICRYL 6-0 S14 CTD (SUTURE) IMPLANT
SUT VICRYL 7 0 TG140 8 (SUTURE) IMPLANT
SUTURE CHRMC 4-0 M2 12X2 ARM (SUTURE) IMPLANT
SYR 10ML LL (SYRINGE) ×2 IMPLANT
SYR 3ML LL SCALE MARK (SYRINGE) ×2 IMPLANT
WATER STERILE IRR 250ML POUR (IV SOLUTION) ×2 IMPLANT

## 2021-01-25 NOTE — Anesthesia Preprocedure Evaluation (Signed)
Anesthesia Evaluation  Patient identified by MRN, date of birth, ID band Patient awake    Airway Mallampati: I  TM Distance: >3 FB Neck ROM: Full    Dental no notable dental hx.    Pulmonary sleep apnea and Continuous Positive Airway Pressure Ventilation ,    Pulmonary exam normal        Cardiovascular Exercise Tolerance: Good hypertension, Normal cardiovascular exam     Neuro/Psych  Neuromuscular disease (Parkinson's disease, tremor and rigidity are main symptoms, no memory impairment)    GI/Hepatic GERD  ,  Endo/Other  diabetes, Type 2  Renal/GU      Musculoskeletal   Abdominal   Peds  Hematology negative hematology ROS (+)   Anesthesia Other Findings   Reproductive/Obstetrics                             Anesthesia Physical Anesthesia Plan  ASA: III  Anesthesia Plan: General   Post-op Pain Management:    Induction:   PONV Risk Score and Plan: 2 and Treatment may vary due to age or medical condition  Airway Management Planned: Nasal Cannula and Natural Airway  Additional Equipment: None  Intra-op Plan:   Post-operative Plan:   Informed Consent: I have reviewed the patients History and Physical, chart, labs and discussed the procedure including the risks, benefits and alternatives for the proposed anesthesia with the patient or authorized representative who has indicated his/her understanding and acceptance.       Plan Discussed with: CRNA  Anesthesia Plan Comments: (Brief unawareness during local anesthetic administration followed by MAC)        Anesthesia Quick Evaluation

## 2021-01-25 NOTE — Anesthesia Postprocedure Evaluation (Signed)
Anesthesia Post Note  Patient: John Mccall  Procedure(s) Performed: BLEPHAROPLASTY UPPER EYELID; W/EXCESS SKIN BROW PTOSIS REPAIR BILATERAL DIABETIC (Bilateral Eye)     Patient location during evaluation: PACU Anesthesia Type: General Level of consciousness: awake and alert Pain management: pain level controlled Vital Signs Assessment: post-procedure vital signs reviewed and stable Respiratory status: spontaneous breathing, nonlabored ventilation, respiratory function stable and patient connected to nasal cannula oxygen Cardiovascular status: blood pressure returned to baseline and stable Postop Assessment: no apparent nausea or vomiting Anesthetic complications: no   No complications documented.  Adele Barthel Maxxon Schwanke

## 2021-01-25 NOTE — Op Note (Signed)
Preoperative Diagnosis:   1.  Visually significant bilateral brow ptosis.  2.  Visually significant dermatochalasis both Upper Eyelid(s)  Postoperative Diagnosis: Same.   Procedure(s) Performed:   1. bilateral  Direct brow lift to improve vision.  2.  Upper eyelid blepharoplasty with excess skin excision bilateral  Upper Eyelid(s)  Surgeon: Philis Pique. [name], M.D.   Assistants: None   Anesthesia: MAC  Specimens: None.  Estimated Blood Loss: Minimal.  Complications: None.  Operative Findings: None Dictated  PROCEDURE:   Allergies were reviewed and the patient is allergic to levaquin [levofloxacin in d5w]..   After the risks, benefits, complications and alternatives were discussed with the patient, appropriate informed consent was obtained. While seated in an upright position and looking in primary gaze, the amount of supra-brow skin to be removed was measured and marked in an elliptical pattern. The patient was then brought to the operating suite and reclined supine.  Timeout was conducted and the patient was sedated. Local anesthetic consisting of a 50-50 mixture of 2% lidocaine with epinephrine and 0.75% bupivacaine with added Hylenex was injected subcutaneously to the bilateral  brow region(s) and down to the periosteum and  subcutaneously to both  upper eyelid(s). After adequate local was instilled, the patient was prepped and draped in the usual sterile fashion for eyelid surgery.   Attention was turned to the right brow region. A #15 blade was used to create a bevelled incision along the premarked incision line. A skin and subcutaneous tissue flap was then excised and hemostasis was obtained with bipolar cautery. The deep tissues were reapproximated with interrupted vertical 5-0 chromic sutures. The skin margin was reapproximated with a running locking 5-0 Prolene suture. Attention was then turned to the opposite brow region where the same procedure was performed in the same manner.     Attention was then turned to the upper eyelids. A 74m upper eyelid crease incision line was marked with calipers on both  upper eyelid(s).  A pinch test was used to estimate the amount of excess skin to remove and this was marked in standard blepharoplasty style fashion. Attention was turned to the right  upper eyelid. A #15 blade was used to open the premarked incision line. A Skin and muscle flap was excised and hemostasis was obtained with bipolar cautery.   Attention was then turned to the opposite eyelid where the same procedure was performed in the same manner.   The skin incisions were closed with a combination of interrupted and  running 6-0 fast absorbing plain gut suture.   The patient tolerated the procedure well. Erythromycin ophthalmic ointment was applied to the incision site(s) followed by ice packs.The patient was taken to the recovery area where he recovered without difficulty.  Post-Op Plan/Instructions:  The patient was instructed to use ice packs frequently for the next 48 hours.  he was instructed to use Erythromycin ophthalmic ophthalmic ointment on the eyelid incisions and over-the-counter antibiotic ointment on the brow sutures 4 times a day for the next 12 to 14 days. he was given a prescription for Tramadol (or similar) for pain control should Tylenol not be effective. he was asked to to follow up in 10-12 days time for suture removal or sooner as needed for problems.   Ellen Goris M. FVickki Muff M.D. Ophthalmology

## 2021-01-25 NOTE — Transfer of Care (Signed)
Immediate Anesthesia Transfer of Care Note  Patient: John Mccall  Procedure(s) Performed: BLEPHAROPLASTY UPPER EYELID; W/EXCESS SKIN BROW PTOSIS REPAIR BILATERAL DIABETIC (Bilateral Eye)  Patient Location: PACU  Anesthesia Type: General  Level of Consciousness: awake, alert  and patient cooperative  Airway and Oxygen Therapy: Patient Spontanous Breathing and Patient connected to supplemental oxygen  Post-op Assessment: Post-op Vital signs reviewed, Patient's Cardiovascular Status Stable, Respiratory Function Stable, Patent Airway and No signs of Nausea or vomiting  Post-op Vital Signs: Reviewed and stable  Complications: No complications documented.

## 2021-01-25 NOTE — Interval H&P Note (Signed)
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  The various methods of treatment have been discussed with the patient and family. After consideration of risks, benefits and other options for treatment, the patient has consented to  Procedure(s) with comments: BLEPHAROPLASTY UPPER EYELID; W/EXCESS SKIN BROW PTOSIS REPAIR BILATERAL DIABETIC (Bilateral) - Diabetic - oral meds as a surgical intervention.  The patient's history has been reviewed, patient examined, no change in status, stable for surgery.  I have reviewed the patient's chart and labs.  Questions were answered to the patient's satisfaction.     [name], [name]

## 2021-01-25 NOTE — Anesthesia Procedure Notes (Signed)
Date/Time: 01/25/2021 7:41 AM Performed by: Cameron Ali, CRNA Pre-anesthesia Checklist: Patient identified, Emergency Drugs available, Suction available, Timeout performed and Patient being monitored Patient Re-evaluated:Patient Re-evaluated prior to induction Oxygen Delivery Method: Nasal cannula Placement Confirmation: positive ETCO2

## 2021-01-25 NOTE — H&P (Signed)
[place]: [facility]  Primary Care Physician:  Kirk Ruths, MD Ophthalmologist: Dr. Philis Pique. John Mccall, M.D.  Pre-Procedure History & Physical: HPI:  John Mccall is a 65 y.o. male here for periocular surgery.   Past Medical History:  Diagnosis Date  . Compression fracture of L1 lumbar vertebra (H[facility])   . Diabetes mellitus without complication (John Mccall)    Type II  . Dysphagia, unspecified(787.20)   . GERD (gastroesophageal reflux disease)   . Hypertension   . Parkinson's disease (John Mccall)   . Prostatitis   . Sleep apnea    CPAP  . Syncope and collapse   . Wears hearing aid in both ears     Past Surgical History:  Procedure Laterality Date  . CARDIAC CATHETERIZATION  05/2004   [facility]    Prior to Admission medications   Medication Sig Start Date End Date Taking? Authorizing Provider  ALPRAZolam John Mccall) 0.25 MG tablet Take 0.5 mg by mouth at bedtime as needed for sleep.   Yes [provider]  amantadine (SYMMETREL) 100 MG capsule Take 100 mg by mouth 2 (two) times daily.   Yes [provider]  carbidopa-levodopa (SINEMET CR) 50-200 MG tablet Take 1 tablet by mouth every morning.   Yes [provider]  carbidopa-levodopa (SINEMET IR) 25-100 MG tablet Take 2 tablets by mouth 3 (three) times daily with meals.   Yes [provider]  escitalopram (LEXAPRO) 10 MG tablet Take 10 mg by mouth daily.   Yes [provider]  losartan (COZAAR) 50 MG tablet Take 50 mg by mouth daily.   Yes [provider]  meloxicam (MOBIC) 15 MG tablet Take 15 mg by mouth daily.   Yes [provider]  metFORMIN (GLUCOPHAGE) 500 MG tablet Take 1,000 mg by mouth 2 (two) times daily with a meal.   Yes [provider]  rosuvastatin (CRESTOR) 5 MG tablet Take 5 mg by mouth daily.   Yes [provider]  sitaGLIPtin (JANUVIA) 100 MG tablet Take 100 mg by mouth daily.   Yes [provider]  omeprazole (PRILOSEC) 20 MG  capsule Take 1 capsule (20 mg total) by mouth 2 (two) times daily. Patient not taking: Reported on 01/17/2021 04/11/13   Wellington Hampshire, MD    Allergies as of 11/16/2020 - Review Complete 02/13/2014  Allergen Reaction Noted  . Levaquin [levofloxacin in d5w]  04/11/2013    Family History  Family history unknown: Yes    Social History   Socioeconomic History  . Marital status: Married    Spouse name: Not on file  . Number of children: Not on file  . Years of education: Not on file  . Highest education level: Not on file  Occupational History  . Not on file  Tobacco Use  . Smoking status: Never Smoker  . Smokeless tobacco: Never Used  Vaping Use  . Vaping Use: Never used  Substance and Sexual Activity  . Alcohol use: No  . Drug use: No    Types: Marijuana    Comment: past  . Sexual activity: Not on file  Other Topics Concern  . Not on file  Social History Narrative  . Not on file   Social Determinants of Health   Financial Resource Strain: Not on file  Food Insecurity: Not on file  Transportation Needs: Not on file  Physical Activity: Not on file  Stress: Not on file  Social Connections: Not on file  Intimate Partner Violence: Not on file  Review of Systems: See HPI, otherwise negative ROS  Physical Exam: BP 128/68   Pulse 65   Ht 5\' 11"  (1.803 m)   Wt 101.2 kg   SpO2 97%   BMI 31.10 kg/m  General:   Alert and cooperative in NAD Head:  Normocephalic and atraumatic. Respiratory:  Normal work of breathing.  Impression/Plan: John Mccall is here for periocular surgery.  Risks, benefits, limitations, and alternatives regarding surgery have been reviewed with the patient.  Questions have been answered.  All parties agreeable.   Karle Starch, MD  01/25/2021, 7:33 AM

## 2021-01-28 ENCOUNTER — Encounter: Payer: Self-pay | Admitting: Ophthalmology

## 2021-07-17 ENCOUNTER — Encounter: Payer: Self-pay | Admitting: Physical Therapy

## 2021-07-17 ENCOUNTER — Encounter: Payer: Self-pay | Admitting: Speech Pathology

## 2021-07-17 ENCOUNTER — Ambulatory Visit: Payer: Medicare PPO | Attending: Neurosurgery | Admitting: Physical Therapy

## 2021-07-17 ENCOUNTER — Other Ambulatory Visit: Payer: Self-pay

## 2021-07-17 ENCOUNTER — Ambulatory Visit: Payer: Medicare PPO | Admitting: Speech Pathology

## 2021-07-17 DIAGNOSIS — R2681 Unsteadiness on feet: Secondary | ICD-10-CM | POA: Insufficient documentation

## 2021-07-17 DIAGNOSIS — M6281 Muscle weakness (generalized): Secondary | ICD-10-CM | POA: Insufficient documentation

## 2021-07-17 DIAGNOSIS — R471 Dysarthria and anarthria: Secondary | ICD-10-CM | POA: Diagnosis present

## 2021-07-17 DIAGNOSIS — R41841 Cognitive communication deficit: Secondary | ICD-10-CM | POA: Insufficient documentation

## 2021-07-17 DIAGNOSIS — R2689 Other abnormalities of gait and mobility: Secondary | ICD-10-CM | POA: Diagnosis not present

## 2021-07-17 DIAGNOSIS — R293 Abnormal posture: Secondary | ICD-10-CM | POA: Insufficient documentation

## 2021-07-17 NOTE — Addendum Note (Signed)
Addended by: Verdene Lennert on: 07/17/2021 04:46 PM   Modules accepted: Orders

## 2021-07-17 NOTE — Therapy (Addendum)
[place]. [place], [place], [address] Phone: [phone]   Fax:  [phone]  Speech Language Pathology Evaluation  Patient Details  Name: John Mccall MRN: [id number] Date of Birth: June 16, 1956 Referring Provider (SLP): Rito Ehrlich MD   Encounter Date: 07/17/2021   End of Session - 07/17/21 1137     Visit Number 1    Number of Visits 9    Date for SLP Re-Evaluation 09/16/21    SLP Start Time 89    SLP Stop Time  1100    SLP Time Calculation (min) 45 min    Activity Tolerance Patient tolerated treatment well             Past Medical History:  Diagnosis Date   Compression fracture of L1 lumbar vertebra ([place])    Diabetes mellitus without complication ([place])    Type II   Dysphagia, unspecified(787.20)    GERD (gastroesophageal reflux disease)    Hypertension    Parkinson's disease ([place])    Prostatitis    Sleep apnea    CPAP   Syncope and collapse    Wears hearing aid in both ears     Past Surgical History:  Procedure Laterality Date   BROW LIFT Bilateral 01/25/2021   Procedure: BLEPHAROPLASTY UPPER EYELID; W/EXCESS SKIN BROW PTOSIS REPAIR BILATERAL DIABETIC;  Surgeon: Karle Starch, MD;  Location: [facility];  Service: Ophthalmology;  Laterality: Bilateral;  Diabetic - oral meds   CARDIAC CATHETERIZATION  05/2004   [facility]    There were no vitals filed for this visit.       SLP Evaluation [facility] - 07/17/21 0001       SLP Visit Information   SLP Received On 07/17/21    Referring Provider (SLP) Rito Ehrlich MD    Onset Date 2018    Medical Diagnosis Parkinson's Disease      Subjective   Patient/Family Stated Goal To work on thinking skills      Pain Assessment   Currently in Pain? No/denies      General Information   HPI Pt was diagnosed in 2018 with Parkinson's Disease. Pt saw SLP in 2021 for voice/swallow/cognitive assessment. Pt decided not to pursue speech at that  time.      Balance Screen   Has the patient fallen in the past 6 months Yes    How many times? 1    Has the patient had a decrease in activity level because of a fear of falling?  No    Is the patient reluctant to leave their home because of a fear of falling?  No      Prior Functional Status   Type of Home House     Lives With Spouse    Available Support Family    Education Completed HS    Vocation Retired      Associate Professor   Overall Cognitive Status Impaired/Different from baseline    Area of Impairment Attention;Memory;Awareness    Executive Function Self Monitoring      Auditory Comprehension   Overall Auditory Comprehension Appears within functional limits for tasks assessed      Verbal Expression   Overall Verbal Expression Appears within functional limits for tasks assessed      Oral Motor/Sensory Function   Overall Oral Motor/Sensory Function Appears within functional limits for tasks assessed      Motor Speech   Overall Motor Speech Impaired    Respiration Within functional limits  Phonation Low vocal intensity    Resonance Within functional limits    Articulation Within functional limitis    Intelligibility Intelligible    Motor Planning Witnin functional limits    Motor Speech Errors Not applicable    Effective Techniques Increased vocal intensity    Phonation Impaired    Volume Appropriate;Soft;Decibel Level   76 dB avg     Standardized Assessments   Standardized Assessments  Other Assessment    Other Assessment Voice Handicap Index, Informal voice evaluation, SLUMS      Plan   Duration 8 weeks                             SLP Education - 07/17/21 1518     Education Details cognitive-communication impairment, hypokinetic dysarthria    Person(s) Educated Patient;Spouse    Methods Explanation    Comprehension Verbalized understanding;Need further instruction   to provide handout             SLP Short Term Goals - 07/17/21 1450        SLP SHORT TERM GOAL #1   Title Pt will recall 3 memory strategies to assist with recall of important information across 4 sessions.    Time 4    Period Weeks    Status New    Target Date 08/14/21      SLP SHORT TERM GOAL #2   Title Pt will recall 3 attention strategies to assist to increase focus and decrease cognitive fatigue across 4 sessions.    Time 4    Period Weeks    Status New    Target Date 08/14/21      SLP SHORT TERM GOAL #3   Title Pt/caregiver to verbalize 3 overt s/sx of dysphagia/aspiration for early ID of swallowing impairment.    Time 4    Period Weeks    Status New    Target Date 08/14/21      SLP SHORT TERM GOAL #4   Title SLP to complete baseline measures for voice.    Time 4    Period Weeks    Status New    Target Date 08/14/21              SLP Long Term Goals - 07/17/21 1452       SLP LONG TERM GOAL #1   Title Pt will report succesful use of memory strategies to increase recall of important information at the end of 8 weeks.    Time 8    Period Weeks    Status New    Target Date 09/11/21      SLP LONG TERM GOAL #2   Title Pt will report the use of attention strategies to decrease cognitive load and increase focus for better participation in ADLs at the end of 8 weeks.    Time 8    Period Weeks    Status New    Target Date 09/11/21              Plan - 07/17/21 1138     Clinical Impression Statement Pt is a 65 year old male who presents to OP ST Evaluation with dx of Parkinson's Disease (2018). Hearing aids were in place during the assessment and pt was able to appropriately answer questions. Pt and wife endorsed pt difficulty with processing information and maintaining attention during conversations. Pt's wife reports pt's vocal intensity and quality has "decreased" within the last year; however, pt  implied wife's hearing loss impacts her impression of his voice. Pt expressed his concerns with swallowing and "coughs a few times  after he eats", but this is typically 5-10 minutes after meals. Coughing occurs after eating--not while drinking liquids, eating foods, or taking medications. Pt indicated that he feels his memory is fine, but has difficulty with attention.  Pt participated in the Vocal Handicap Index in order to measure the influence of voice issues on the patient's quality of life. Pt obtained a total score of 18 indicating a "mild" severity level. SLP measured pt's maximum phonation duration time to be 24 seconds and sustained phonation /a/ to be an average of 76 dB both to be [facility].  SLP judged pt's respiratory support, articulation, resonance, and prosody to be [facility]. SLP notes wife report of decreased volume in conversation. SLP to provide pt with vocal exercises for maintenance and encouraged pt to f/u ~ 6 months in order to screen for progressive decline. SLP conducted the [facility] Protocol in order to assess pt's swallowing function. Pt was observed to be [facility]. There was no interrupted drinking, coughing, or choking during/immediately after completion of drinking indicating a "pass". SLP suggests continuation of regular/thin liquid diet. Wife and pt to be edu on s/sx of dysphagia/aspiration to increase likelihood of early identification as disease progresses. Pt and wife were directed to reach out to SLP if any overt s/sx of aspiration. The SLUMS was administered in order to screen the pt's cognitive-linguistic abilities. Pt obtained a score of 19/30 on the SLUMS, indicating mild-to-moderate cognitive-communication impairments. Areas of impairment were evident in attention, working memory, short term memory, and problem solving. With repetition of question, pt was able to increase score to a 22/30. SLP rec skilled speech services to train patient in compensatory strategies and edu on s/sx of dysphagia/aspiration. SLP to obtain further information on voice in conversation.    Speech Therapy Frequency 1x /week    Duration 8  weeks    Treatment/Interventions Patient/family education;Environmental controls;Cognitive reorganization;Compensatory techniques;Internal/external aids;SLP instruction and feedback    Potential to Achieve Goals Good    Consulted and Agree with Plan of Care Patient;Family member/caregiver    Family Member Consulted Coralyn Mark - wife             Patient will benefit from skilled therapeutic intervention in order to improve the following deficits and impairments:   Cognitive communication deficit  Dysarthria and anarthria    Problem List Patient Active Problem List   Diagnosis Date Noted   Palpitations 02/13/2014   Dyspnea 02/13/2014    Sydney L [employer]. Communication Sciences and Disorders  07/17/2021, 4:45 PM  [place]. [place], [place], [address] Phone: [phone]   Fax:  [phone]  Name: John Mccall MRN: [id number] Date of Birth: 1956/04/30

## 2021-07-17 NOTE — Therapy (Signed)
[place]. [place], [place], [address] Phone: [phone]   Fax:  [phone]  Physical Therapy Evaluation  Patient Details  Name: John Mccall MRN: [id number] Date of Birth: Feb 27, 1956 Referring Provider (PT): Rito Ehrlich, MD   Encounter Date: 07/17/2021   PT End of Session - 07/17/21 1154     Visit Number 1    Number of Visits 9    Date for PT Re-Evaluation 09/11/21    Authorization Type [employer]    PT Start Time 1102    PT Stop Time 1148    PT Time Calculation (min) 46 min    Equipment Utilized During Treatment Gait belt    Activity Tolerance Patient tolerated treatment well    Behavior During Therapy WFL for tasks assessed/performed             Past Medical History:  Diagnosis Date   Compression fracture of L1 lumbar vertebra ([place])    Diabetes mellitus without complication ([place])    Type II   Dysphagia, unspecified(787.20)    GERD (gastroesophageal reflux disease)    Hypertension    Parkinson's disease ([place])    Prostatitis    Sleep apnea    CPAP   Syncope and collapse    Wears hearing aid in both ears     Past Surgical History:  Procedure Laterality Date   BROW LIFT Bilateral 01/25/2021   Procedure: BLEPHAROPLASTY UPPER EYELID; W/EXCESS SKIN BROW PTOSIS REPAIR BILATERAL DIABETIC;  Surgeon: Karle Starch, MD;  Location: [facility];  Service: Ophthalmology;  Laterality: Bilateral;  Diabetic - oral meds   CARDIAC CATHETERIZATION  05/2004   [facility]    There were no vitals filed for this visit.    Subjective Assessment - 07/17/21 1101     Subjective Patient reports that he was out on a boat recently and experienced a spasm in his back. Was prescribed a muscle relaxant and this pain has since resolved. Pain lasted for about a week. Patient reports that he was diagnosed with Parkinson's 5 years ago. May have had some symptoms before then. Notes some decreased muscle mass, fatigue,  changes in gait. Denies changes in balance. Reports a "weird sensation in the R LE" and wife reports shuffling. Had a fall last week while walking downstairs in the dark, missed a step and fell, scraping his R knee. Wife reports that patient feels better when walking with a grocery cart. Owns a cane but doesn't use it. Reports R>L UE and LE more affected by his Parkinson's.    Patient is accompained by: Family member   wife   Pertinent History hx L1 compression fx, DMII, dysphagia, GERD, HTN, Parkinson's, syncope, hearing aids    Limitations Lifting;Standing;Walking;House hold activities    How long can you stand comfortably? variable    How long can you walk comfortably? variable- able to walk an hour or more, limited d/t fatigue    Diagnostic tests 11/18/13 lumbar xray: Chronic L1 compression deformity by evidence of acute osseous abnormalities. Spondylosis at the L5-S1 level.    Patient Stated Goals work on strength and walking    Currently in Pain? No/denies                [facility] PT Assessment - 07/17/21 1110       Assessment   Medical Diagnosis Parkinson's disease, Low back pain, unspecified    Referring Provider (PT) Rito Ehrlich, MD    Onset Date/Surgical Date 07/17/16  Next MD Visit in 4-6 months    Prior Therapy yes- for LB      Precautions   Precautions Fall      Balance Screen   Has the patient fallen in the past 6 months Yes    How many times? 1    Has the patient had a decrease in activity level because of a fear of falling?  No    Is the patient reluctant to leave their home because of a fear of falling?  No      Home Ecologist residence    Living Arrangements Spouse/significant other    Available Help at Discharge Family    Type of [place] to enter    Entrance Stairs-Number of Steps 6    Entrance Stairs-Rails Left;Right;Can reach both    [place] One level    B and E - single  point;Walker - 2 wheels      Prior Function   Level of Independence Independent    Vocation Retired    Leisure hunting, fishing, driving      Cognition   Overall Cognitive Status Within Functional Limits for tasks assessed      Observation/Other Assessments   Observations moderate R hand resting tremor; R knee abraision      Sensation   Light Touch Appears Intact   notes occasional N/T in B toes     Coordination   Gross Motor Movements are Fluid and Coordinated --   slow gross motor movements- able to demonstrate alt B supination/pronation with mild B discoordination; finger to nose with veyr mild dysmetria on R vs L hand     Posture/Postural Control   Posture/Postural Control Postural limitations    Postural Limitations Rounded Shoulders;Forward head;Increased thoracic kyphosis;Posterior pelvic tilt   in sitting     ROM / Strength   AROM / PROM / Strength AROM;Strength      AROM   AROM Assessment Site Ankle    Right/Left Ankle Right;Left    Right Ankle Dorsiflexion 20    Left Ankle Dorsiflexion 15      Strength   Strength Assessment Site Hip;Knee;Ankle    Right/Left Hip Right;Left    Right Hip Flexion 4/5    Right Hip ABduction 4/5    Right Hip ADduction 4+/5    Left Hip Flexion 4/5    Left Hip ABduction 4/5    Left Hip ADduction 4+/5    Right/Left Knee Right;Left    Right Knee Flexion 4/5    Right Knee Extension 4+/5    Left Knee Flexion 4+/5    Left Knee Extension 4/5    Right/Left Ankle Right;Left    Right Ankle Dorsiflexion 4+/5    Right Ankle Plantar Flexion 4/5    Left Ankle Dorsiflexion 4+/5    Left Ankle Plantar Flexion 4/5      Ambulation/Gait   Assistive device None    Gait Pattern Step-through pattern;Lateral trunk lean to right;Decreased stance time - right;Decreased step length - left      Standardized Balance Assessment   Standardized Balance Assessment Dynamic Gait Index;Five Times Sit to Stand    Five times sit to stand comments  11.80 sec    no UE support     Dynamic Gait Index   Level Surface Normal    Change in Gait Speed Normal    Gait with Horizontal Head Turns Mild Impairment    Gait with Vertical  Head Turns Mild Impairment    Gait and Pivot Turn Mild Impairment    Step Over Obstacle Normal    Step Around Obstacles Normal    Steps Normal    Total Score 21                        Objective measurements completed on examination: See above findings.                PT Education - 07/17/21 1154     Education Details prognosis, POC, HEP    Person(s) Educated Patient    Methods Explanation;Demonstration;Tactile cues;Verbal cues;Handout    Comprehension Verbalized understanding;Returned demonstration              PT Short Term Goals - 07/17/21 1201       PT SHORT TERM GOAL #1   Title Patient to be independent with initial HEP.    Time 3    Period Weeks    Status New    Target Date 08/07/21               PT Long Term Goals - 07/17/21 1201       PT LONG TERM GOAL #1   Title Patient to be independent with advanced HEP.    Time 8    Period Weeks    Status New    Target Date 09/11/21      PT LONG TERM GOAL #2   Title Patient to demonstrate B LE strength >/=4+/5.    Time 8    Period Weeks    Status New    Target Date 09/11/21      PT LONG TERM GOAL #3   Title Patient to demonstrate and report improved postural awareness at rest and with activity.    Time 8    Period Weeks    Status New    Target Date 09/11/21      PT LONG TERM GOAL #4   Title Patient to score atleast 22/30 on FGA in order to decrease risk of falls.    Time 8    Period Weeks    Status New    Target Date 09/11/21      PT LONG TERM GOAL #5   Title Patient to participate in walking program or other fitness class/regimen a couple times weekly in order to maintain physical fitness.    Time 8    Period Weeks    Status New    Target Date 09/11/21                    Plan - 07/17/21  1155     Clinical Impression Statement Patient is a 65 y/o M, PMH significant for Parkinson's Disease, presenting to OPPT with wife with c/o chronic progressive weakness and gait deviations for the past 5+ years. Notes R>L UE/LE affected by Parkinson's. Complaints include deceased muscle mass, fatigue, and changes in gait. Admits to 1 fall last week when he was walking downstairs in the dark. Patient denies changes in balance, however wife recalls that the patient feels better when ambulating with assistance of shopping cart. Currently patient does not use an AD. Also notes recent episode of LB spasm which has since fully resolved. Patient today presenting with severely rounded and kyphotic posture, mild discoordination and dysmetria in R hand with coordination testing, B LE weakness, and gait deviations. Patient's scores on DGI and 5xSTS indicate a decreased risk of falls currently,  however d/t the progressive nature of patient's disorder, would benefit from balance and gait training to slow progression of his deficits. Patient was educated on gentle postural correction and balance HEP to be performed at counter top for safety and reported understanding. Would benefit from skilled PT services 1x/week for 8 weeks to address aforementioned impairments.    Personal Factors and Comorbidities Age;Fitness;Comorbidity 3+;Time since onset of injury/illness/exacerbation;Past/Current Experience    Comorbidities hx L1 compression fx, DMII, dysphagia, GERD, HTN, Parkinson's, syncope, hearing aids    Examination-Activity Limitations Bathing;Locomotion Level;Transfers;Reach Overhead;Bed Mobility;Bend;Sit;Carry;Squat;Dressing;Stairs;Stand;Hygiene/Grooming;Lift    Examination-Participation Restrictions [employer];Shop;Community Activity;Cleaning;Church;Meal Prep    Stability/Clinical Decision Making Stable/Uncomplicated    Clinical Decision Making Low    Rehab Potential Good    PT Frequency 1x / week    PT  Duration 8 weeks    PT Treatment/Interventions ADLs/Self Care Home Management;Canalith Repostioning;Cryotherapy;Electrical Stimulation;DME Instruction;Ultrasound;Moist Heat;Gait training;Stair training;Functional mobility training;Therapeutic activities;Therapeutic exercise;Balance training;Neuromuscular re-education;Manual techniques;Patient/family education;Passive range of motion;Dry needling;Energy conservation;Taping    PT Next Visit Plan reassess HEP; progress thoracolumbar mobility and postural correction; high level balance; assess gait with SPC for appropriateness in the future    Consulted and Agree with Plan of Care Patient;Family member/caregiver    Family Member Consulted wife             Patient will benefit from skilled therapeutic intervention in order to improve the following deficits and impairments:  Abnormal gait, Decreased coordination, Difficulty walking, Decreased activity tolerance, Pain, Improper body mechanics, Impaired flexibility, Decreased balance, Postural dysfunction, Decreased strength  Visit Diagnosis: Other abnormalities of gait and mobility  Abnormal posture  Muscle weakness (generalized)  Unsteadiness on feet     Problem List Patient Active Problem List   Diagnosis Date Noted   Palpitations 02/13/2014   Dyspnea 02/13/2014    Janene Harvey, PT, DPT 07/17/21 12:08 PM   [place]. [place], [place], [address] Phone: [phone]   Fax:  [phone]  Name: BRANTLEY WILEY MRN: [id number] Date of Birth: Aug 11, 1956

## 2021-07-17 NOTE — Patient Instructions (Signed)
Access Code: [email or web address] URL: [email or web address] Date: 07/17/2021 Prepared by: Grayling Congress  Exercises Marching with Resistance - 2 x daily - 7 x weekly - 2 sets - 10 reps Tandem Walking with Counter Support - 2 x daily - 7 x weekly - 2 sets - 10 reps Heel Raises with Counter Support - 2 x daily - 7 x weekly - 2 sets - 10 reps Standing Trunk Rotation with Resistance - 2 x daily - 7 x weekly - 2 sets - 10 reps Standing Shoulder Row with Anchored Resistance - 2 x daily - 7 x weekly - 2 sets - 10 reps

## 2021-07-24 ENCOUNTER — Other Ambulatory Visit: Payer: Self-pay

## 2021-07-24 ENCOUNTER — Ambulatory Visit: Payer: Medicare PPO | Admitting: Speech Pathology

## 2021-07-24 ENCOUNTER — Encounter: Payer: Self-pay | Admitting: Speech Pathology

## 2021-07-24 DIAGNOSIS — R41841 Cognitive communication deficit: Secondary | ICD-10-CM

## 2021-07-24 DIAGNOSIS — R2689 Other abnormalities of gait and mobility: Secondary | ICD-10-CM | POA: Diagnosis not present

## 2021-07-24 NOTE — Therapy (Signed)
[place]. [place], [place], [address] Phone: [phone]   Fax:  [phone]  Speech Language Pathology Treatment  Patient Details  Name: John Mccall MRN: [id number] Date of Birth: [birth year] Referring Provider (SLP): Rito Ehrlich MD   Encounter Date: 07/24/2021   End of Session - 07/24/21 1023     Visit Number 2    Number of Visits 9    Date for SLP Re-Evaluation 09/16/21    SLP Start Time 0962    SLP Stop Time  1100    SLP Time Calculation (min) 45 min    Activity Tolerance Patient tolerated treatment well             Past Medical History:  Diagnosis Date   Compression fracture of L1 lumbar vertebra ([place])    Diabetes mellitus without complication ([place])    Type II   Dysphagia, unspecified(787.20)    GERD (gastroesophageal reflux disease)    Hypertension    Parkinson's disease ([place])    Prostatitis    Sleep apnea    CPAP   Syncope and collapse    Wears hearing aid in both ears     Past Surgical History:  Procedure Laterality Date   BROW LIFT Bilateral 01/25/2021   Procedure: BLEPHAROPLASTY UPPER EYELID; W/EXCESS SKIN BROW PTOSIS REPAIR BILATERAL DIABETIC;  Surgeon: Karle Starch, MD;  Location: [facility];  Service: Ophthalmology;  Laterality: Bilateral;  Diabetic - oral meds   CARDIAC CATHETERIZATION  05/2004   [facility]    There were no vitals filed for this visit.   Subjective Assessment - 07/24/21 1021     Subjective Pt reports no new changes. Voice was [facility] and no changes since previous assessment.    Currently in Pain? No/denies                   ADULT SLP TREATMENT - 07/24/21 1025       General Information   Behavior/Cognition Cooperative;Alert      Treatment Provided   Treatment provided Cognitive-Linquistic      Cognitive-Linquistic Treatment   Treatment focused on Cognition    Skilled Treatment Assessed pt with medication managment. Pt required  occasional redirection to task. Pt required minA to complete; placing all pills in the "PM" vs. "AM and PM" for pills needed twice a day. Pt "got lost" during task when alternating between (his) initiated conversation and the ask. Required extra time. Began training in attention strategies. Pt demonstrated understanding by providing examples of how he could implement at home.      Progression Toward Goals   Progression toward goals Progressing toward goals                SLP Short Term Goals - 07/17/21 1450       SLP SHORT TERM GOAL #1   Title Pt will recall 3 memory strategies to assist with recall of important information across 4 sessions.    Time 4    Period Weeks    Status New    Target Date 08/14/21      SLP SHORT TERM GOAL #2   Title Pt will recall 3 attention strategies to assist to increase focus and decrease cognitive fatigue across 4 sessions.    Time 4    Period Weeks    Status New    Target Date 08/14/21      SLP SHORT TERM GOAL #3   Title Pt/caregiver to verbalize 3  overt s/sx of dysphagia/aspiration for early ID of swallowing impairment.    Time 4    Period Weeks    Status New    Target Date 08/14/21      SLP SHORT TERM GOAL #4   Title SLP to complete baseline measures for voice.    Time 4    Period Weeks    Status New    Target Date 08/14/21              SLP Long Term Goals - 07/17/21 1452       SLP LONG TERM GOAL #1   Title Pt will report succesful use of memory strategies to increase recall of important information at the end of 8 weeks.    Time 8    Period Weeks    Status New    Target Date 09/11/21      SLP LONG TERM GOAL #2   Title Pt will report the use of attention strategies to decrease cognitive load and increase focus for better participation in ADLs at the end of 8 weeks.    Time 8    Period Weeks    Status New    Target Date 09/11/21              Plan - 07/24/21 1024     Clinical Impression Statement See tx note.  Pt reports no new concerns. Cont with current POC.    Speech Therapy Frequency 1x /week    Duration 8 weeks    Treatment/Interventions Patient/family education;Environmental controls;Cognitive reorganization;Compensatory techniques;Internal/external aids;SLP instruction and feedback    Potential to Achieve Goals Good    Consulted and Agree with Plan of Care Patient;Family member/caregiver    Family Member Consulted Coralyn Mark - wife             Patient will benefit from skilled therapeutic intervention in order to improve the following deficits and impairments:   Cognitive communication deficit    Problem List Patient Active Problem List   Diagnosis Date Noted   Palpitations 02/13/2014   Dyspnea 02/13/2014    Verdene Lennert MS, [place], CBIS  07/24/2021, 10:33 AM  [place]. [place], [place], [address] Phone: [phone]   Fax:  [phone]   Name: John Mccall MRN: [id number] Date of Birth: April 28, 1956

## 2021-07-31 ENCOUNTER — Ambulatory Visit: Payer: Medicare PPO | Admitting: Physical Therapy

## 2021-07-31 ENCOUNTER — Ambulatory Visit: Payer: Medicare PPO | Admitting: Speech Pathology

## 2021-08-02 ENCOUNTER — Ambulatory Visit: Payer: Medicare PPO | Admitting: Physical Therapy

## 2021-08-02 ENCOUNTER — Other Ambulatory Visit: Payer: Self-pay

## 2021-08-02 ENCOUNTER — Ambulatory Visit: Payer: Medicare PPO | Admitting: Speech Pathology

## 2021-08-02 ENCOUNTER — Encounter: Payer: Self-pay | Admitting: Speech Pathology

## 2021-08-02 DIAGNOSIS — R471 Dysarthria and anarthria: Secondary | ICD-10-CM

## 2021-08-02 DIAGNOSIS — R2689 Other abnormalities of gait and mobility: Secondary | ICD-10-CM

## 2021-08-02 DIAGNOSIS — R41841 Cognitive communication deficit: Secondary | ICD-10-CM

## 2021-08-02 DIAGNOSIS — M6281 Muscle weakness (generalized): Secondary | ICD-10-CM

## 2021-08-02 DIAGNOSIS — R2681 Unsteadiness on feet: Secondary | ICD-10-CM

## 2021-08-02 DIAGNOSIS — R293 Abnormal posture: Secondary | ICD-10-CM

## 2021-08-02 NOTE — Therapy (Signed)
[place]. [place], [place], [address] Phone: [phone]   Fax:  [phone]  Speech Language Pathology Treatment  Patient Details  Name: John Mccall MRN: [id number] Date of Birth: 1956/10/13 Referring Provider (SLP): Rito Ehrlich MD   Encounter Date: 08/02/2021   End of Session - 08/02/21 1104     Visit Number 3    Number of Visits 9    Date for SLP Re-Evaluation 09/16/21    SLP Start Time 1101    SLP Stop Time  1141    SLP Time Calculation (min) 40 min    Activity Tolerance Patient tolerated treatment well             Past Medical History:  Diagnosis Date   Compression fracture of L1 lumbar vertebra ([place])    Diabetes mellitus without complication ([place])    Type II   Dysphagia, unspecified(787.20)    GERD (gastroesophageal reflux disease)    Hypertension    Parkinson's disease ([place])    Prostatitis    Sleep apnea    CPAP   Syncope and collapse    Wears hearing aid in both ears     Past Surgical History:  Procedure Laterality Date   BROW LIFT Bilateral 01/25/2021   Procedure: BLEPHAROPLASTY UPPER EYELID; W/EXCESS SKIN BROW PTOSIS REPAIR BILATERAL DIABETIC;  Surgeon: Karle Starch, MD;  Location: [facility];  Service: Ophthalmology;  Laterality: Bilateral;  Diabetic - oral meds   CARDIAC CATHETERIZATION  05/2004   [facility]    There were no vitals filed for this visit.   Subjective Assessment - 08/02/21 1104     Subjective Pt reports he is doing well.    Currently in Pain? No/denies                   ADULT SLP TREATMENT - 08/02/21 1513       General Information   Behavior/Cognition Cooperative;Alert      Treatment Provided   Treatment provided Cognitive-Linquistic      Cognitive-Linquistic Treatment   Treatment focused on Dysarthria;Patient/family/caregiver education    Skilled Treatment Completed baseline measures for voice. Conversation: ~70 dB; Oral Reading:  72 dB; Sustained /a/: ~86 with cueing. SLP provided pt with maintenance program/exercises for voice. (breathing, sustained /a/, glides (high/low), yawn-sighs, functional phrases). Pt reported he will complete functional phrases at home with Coralyn Mark for HEP.      Assessment / Recommendations / Plan   Plan Continue with current plan of care      Progression Toward Goals   Progression toward goals Progressing toward goals              SLP Education - 08/02/21 1518     Education Details Vocal hygiene    Person(s) Educated Patient    Methods Explanation;Demonstration;Handout    Comprehension Verbalized understanding;Need further instruction              SLP Short Term Goals - 07/17/21 1450       SLP SHORT TERM GOAL #1   Title Pt will recall 3 memory strategies to assist with recall of important information across 4 sessions.    Time 4    Period Weeks    Status New    Target Date 08/14/21      SLP SHORT TERM GOAL #2   Title Pt will recall 3 attention strategies to assist to increase focus and decrease cognitive fatigue across 4 sessions.    Time  4    Period Weeks    Status New    Target Date 08/14/21      SLP SHORT TERM GOAL #3   Title Pt/caregiver to verbalize 3 overt s/sx of dysphagia/aspiration for early ID of swallowing impairment.    Time 4    Period Weeks    Status New    Target Date 08/14/21      SLP SHORT TERM GOAL #4   Title SLP to complete baseline measures for voice.    Time 4    Period Weeks    Status New    Target Date 08/14/21              SLP Long Term Goals - 07/17/21 1452       SLP LONG TERM GOAL #1   Title Pt will report succesful use of memory strategies to increase recall of important information at the end of 8 weeks.    Time 8    Period Weeks    Status New    Target Date 09/11/21      SLP LONG TERM GOAL #2   Title Pt will report the use of attention strategies to decrease cognitive load and increase focus for better  participation in ADLs at the end of 8 weeks.    Time 8    Period Weeks    Status New    Target Date 09/11/21              Plan - 08/02/21 1516     Clinical Impression Statement See tx note. Pt reports he did not utilize attention strategies at home. To further address next session. Cont with current POC.    Speech Therapy Frequency 1x /week    Duration 8 weeks    Treatment/Interventions Patient/family education;Environmental controls;Cognitive reorganization;Compensatory techniques;Internal/external aids;SLP instruction and feedback    Potential to Achieve Goals Good    Consulted and Agree with Plan of Care Patient;Family member/caregiver    Family Member Consulted Coralyn Mark - wife             Patient will benefit from skilled therapeutic intervention in order to improve the following deficits and impairments:   Dysarthria and anarthria  Cognitive communication deficit    Problem List Patient Active Problem List   Diagnosis Date Noted   Palpitations 02/13/2014   Dyspnea 02/13/2014    Verdene Lennert  MS, [place], CBIS  08/02/2021, 3:19 PM  [place]. [place], [place], [address] Phone: [phone]   Fax:  [phone]   Name: John Mccall MRN: [id number] Date of Birth: 1956/05/23

## 2021-08-02 NOTE — Therapy (Signed)
[facility]. [place], [place], [address] Phone: [phone]   Fax:  [phone]  Physical Therapy Treatment  Patient Details  Name: John Mccall MRN: [id number] Date of Birth: [birth year] Referring Provider (PT): Rito Ehrlich, MD   Encounter Date: 08/02/2021   PT End of Session - 08/02/21 1028     Visit Number 2    Number of Visits 9    Date for PT Re-Evaluation 09/11/21    Authorization Type [employer]    PT Start Time 1021    PT Stop Time 1100    PT Time Calculation (min) 39 min    Equipment Utilized During Treatment Gait belt    Activity Tolerance Patient tolerated treatment well    Behavior During Therapy WFL for tasks assessed/performed             Past Medical History:  Diagnosis Date   Compression fracture of L1 lumbar vertebra ([place])    Diabetes mellitus without complication ([place])    Type II   Dysphagia, unspecified(787.20)    GERD (gastroesophageal reflux disease)    Hypertension    Parkinson's disease ([place])    Prostatitis    Sleep apnea    CPAP   Syncope and collapse    Wears hearing aid in both ears     Past Surgical History:  Procedure Laterality Date   BROW LIFT Bilateral 01/25/2021   Procedure: BLEPHAROPLASTY UPPER EYELID; W/EXCESS SKIN BROW PTOSIS REPAIR BILATERAL DIABETIC;  Surgeon: Karle Starch, MD;  Location: [facility];  Service: Ophthalmology;  Laterality: Bilateral;  Diabetic - oral meds   CARDIAC CATHETERIZATION  05/2004   [facility]    There were no vitals filed for this visit.   Subjective Assessment - 08/02/21 1025     Subjective Pt reports no issues or pain. Pt notes no new changes since last visit.    Patient is accompained by: Family member   wife   Pertinent History hx L1 compression fx, DMII, dysphagia, GERD, HTN, Parkinson's, syncope, hearing aids    Limitations Lifting;Standing;Walking;House hold activities    How long can you stand comfortably? variable     How long can you walk comfortably? variable- able to walk an hour or more, limited d/t fatigue    Diagnostic tests 11/18/13 lumbar xray: Chronic L1 compression deformity by evidence of acute osseous abnormalities. Spondylosis at the L5-S1 level.    Patient Stated Goals work on strength and walking    Currently in Pain? No/denies                               [facility] Adult PT Treatment/Exercise - 08/02/21 0001       Exercises   Exercises Shoulder;Lumbar      Lumbar Exercises: Aerobic   Nustep L5 x 5 min LEs only      Lumbar Exercises: Standing   Other Standing Lumbar Exercises Trunk rotation with wide staggered stance 2x10 each side green tband    Other Standing Lumbar Exercises Anti rotation green tband side stepping 3x3 with 10 sec hold; tandem stance green tband pertubations      Lumbar Exercises: Seated   Other Seated Lumbar Exercises lumbothoracic extensions in chair x10      Shoulder Exercises: Standing   Horizontal ABduction Strengthening;Both;Theraband    Theraband Level (Shoulder Horizontal ABduction) Level 3 (Green)    Horizontal ABduction Limitations 3x10    External Rotation  Strengthening;Both    Theraband Level (Shoulder External Rotation) Level 3 (Green)    External Rotation Limitations 3x10    Extension Strengthening;Both;Theraband    Theraband Level (Shoulder Extension) Level 3 (Green)    Extension Limitations 3x10    Corporate treasurer;Theraband;Both    Theraband Level (Shoulder Row) Level 3 (Green)    Row Limitations 3x10      Shoulder Exercises: Psychologist, sport and exercise 5 reps;10 seconds                 Balance Exercises - 08/02/21 0001       Balance Exercises: Standing   SLS with Vectors Foam/compliant surface   tap forward x10 alternating feet; tap forward & to the side x10 each foot   Balance Beam x4 trips in // bars; forward and backward tandem    Tandem Gait Forward;3 reps                PT Education -  08/02/21 1205     Education Details Discussed importance of maintaining big and open movements to decrease postural progressions with Parkinson's. Discussed HEP updates    Person(s) Educated Patient    Methods Explanation;Verbal cues;Handout;Demonstration    Comprehension Verbalized understanding;Returned demonstration              PT Short Term Goals - 07/17/21 1201       PT SHORT TERM GOAL #1   Title Patient to be independent with initial HEP.    Time 3    Period Weeks    Status New    Target Date 08/07/21               PT Long Term Goals - 07/17/21 1201       PT LONG TERM GOAL #1   Title Patient to be independent with advanced HEP.    Time 8    Period Weeks    Status New    Target Date 09/11/21      PT LONG TERM GOAL #2   Title Patient to demonstrate B LE strength >/=4+/5.    Time 8    Period Weeks    Status New    Target Date 09/11/21      PT LONG TERM GOAL #3   Title Patient to demonstrate and report improved postural awareness at rest and with activity.    Time 8    Period Weeks    Status New    Target Date 09/11/21      PT LONG TERM GOAL #4   Title Patient to score atleast 22/30 on FGA in order to decrease risk of falls.    Time 8    Period Weeks    Status New    Target Date 09/11/21      PT LONG TERM GOAL #5   Title Patient to participate in walking program or other fitness class/regimen a couple times weekly in order to maintain physical fitness.    Time 8    Period Weeks    Status New    Target Date 09/11/21                   Plan - 08/02/21 1204     Clinical Impression Statement Continued to progress shoulder/periscapular exercises to decrease his thoracic kyphosis. Focused on stabilization and balance on foam today with good pt tolerance. Increased LOB noted when stabilizing with L foot and R foot tapping on cone.    Personal Factors and Comorbidities  Age;Fitness;Comorbidity 3+;Time since onset of  injury/illness/exacerbation;Past/Current Experience    Comorbidities hx L1 compression fx, DMII, dysphagia, GERD, HTN, Parkinson's, syncope, hearing aids    Examination-Activity Limitations Bathing;Locomotion Level;Transfers;Reach Overhead;Bed Mobility;Bend;Sit;Carry;Squat;Dressing;Stairs;Stand;Hygiene/Grooming;Lift    Examination-Participation Restrictions [employer];Shop;Community Activity;Cleaning;Church;Meal Prep    Stability/Clinical Decision Making Stable/Uncomplicated    Rehab Potential Good    PT Frequency 1x / week    PT Duration 8 weeks    PT Treatment/Interventions ADLs/Self Care Home Management;Canalith Repostioning;Cryotherapy;Electrical Stimulation;DME Instruction;Ultrasound;Moist Heat;Gait training;Stair training;Functional mobility training;Therapeutic activities;Therapeutic exercise;Balance training;Neuromuscular re-education;Manual techniques;Patient/family education;Passive range of motion;Dry needling;Energy conservation;Taping    PT Next Visit Plan reassess HEP; progress thoracolumbar mobility and postural correction (consider open/close book, manual for thoracolumbar mobs); high level balance; assess gait with SPC for appropriateness in the future    PT Home Exercise Plan Access Code: [email or web address]    Consulted and Agree with Plan of Care Patient    Family Member Consulted --             Patient will benefit from skilled therapeutic intervention in order to improve the following deficits and impairments:  Abnormal gait, Decreased coordination, Difficulty walking, Decreased activity tolerance, Pain, Improper body mechanics, Impaired flexibility, Decreased balance, Postural dysfunction, Decreased strength  Visit Diagnosis: Other abnormalities of gait and mobility  Abnormal posture  Muscle weakness (generalized)  Unsteadiness on feet     Problem List Patient Active Problem List   Diagnosis Date Noted   Palpitations 02/13/2014   Dyspnea 02/13/2014     Mckynlee Luse April Gordy Levan, PT, DPT 08/02/2021, 12:10 PM  [facility]. [place], [place], [address] Phone: [phone]   Fax:  [phone]  Name: John Mccall MRN: [id number] Date of Birth: 09-29-1956

## 2021-08-07 ENCOUNTER — Ambulatory Visit: Payer: Medicare PPO | Admitting: Speech Pathology

## 2021-08-07 ENCOUNTER — Ambulatory Visit: Payer: Medicare PPO | Admitting: Physical Therapy

## 2021-08-08 ENCOUNTER — Encounter: Payer: Self-pay | Admitting: Physical Therapy

## 2021-08-08 ENCOUNTER — Ambulatory Visit: Payer: Medicare PPO | Admitting: Speech Pathology

## 2021-08-08 ENCOUNTER — Ambulatory Visit: Payer: Medicare PPO | Admitting: Physical Therapy

## 2021-08-08 ENCOUNTER — Other Ambulatory Visit: Payer: Self-pay

## 2021-08-08 ENCOUNTER — Encounter: Payer: Self-pay | Admitting: Speech Pathology

## 2021-08-08 DIAGNOSIS — M6281 Muscle weakness (generalized): Secondary | ICD-10-CM

## 2021-08-08 DIAGNOSIS — R41841 Cognitive communication deficit: Secondary | ICD-10-CM

## 2021-08-08 DIAGNOSIS — R293 Abnormal posture: Secondary | ICD-10-CM

## 2021-08-08 DIAGNOSIS — R2681 Unsteadiness on feet: Secondary | ICD-10-CM

## 2021-08-08 DIAGNOSIS — R2689 Other abnormalities of gait and mobility: Secondary | ICD-10-CM | POA: Diagnosis not present

## 2021-08-08 NOTE — Therapy (Signed)
[facility]. [place], [place], [address] Phone: [phone]   Fax:  [phone]  Speech Language Pathology Treatment  Patient Details  Name: John Mccall MRN: [id number] Date of Birth: [birth year] Referring Provider (SLP): Rito Ehrlich MD   Encounter Date: 08/08/2021   End of Session - 08/08/21 1441     Visit Number 4    Number of Visits 9    Date for SLP Re-Evaluation 09/16/21    SLP Start Time 1100    SLP Stop Time  6962    SLP Time Calculation (min) 45 min    Activity Tolerance Patient tolerated treatment well             Past Medical History:  Diagnosis Date   Compression fracture of L1 lumbar vertebra ([place])    Diabetes mellitus without complication ([place])    Type II   Dysphagia, unspecified(787.20)    GERD (gastroesophageal reflux disease)    Hypertension    Parkinson's disease ([place])    Prostatitis    Sleep apnea    CPAP   Syncope and collapse    Wears hearing aid in both ears     Past Surgical History:  Procedure Laterality Date   BROW LIFT Bilateral 01/25/2021   Procedure: BLEPHAROPLASTY UPPER EYELID; W/EXCESS SKIN BROW PTOSIS REPAIR BILATERAL DIABETIC;  Surgeon: Karle Starch, MD;  Location: [facility];  Service: Ophthalmology;  Laterality: Bilateral;  Diabetic - oral meds   CARDIAC CATHETERIZATION  05/2004   [facility]    There were no vitals filed for this visit.   Subjective Assessment - 08/08/21 1440     Subjective "I am doing pretty good."    Currently in Pain? No/denies                   ADULT SLP TREATMENT - 08/08/21 1448       General Information   Behavior/Cognition Cooperative;Pleasant mood      Treatment Provided   Treatment provided Cognitive-Linquistic      Cognitive-Linquistic Treatment   Treatment focused on Cognition    Skilled Treatment SLP trained pt in attention strategies and provided examples of each. Pt demonstrated an understanding and  scenarios in which each strategy could be implemented in his day. Pt reports difficulty with reading. SLP encouraged pt to read when he feels most alert during the day to decrease chance of feeling fatigue. SLP to cont attention strategies next visit.      Assessment / Recommendations / Plan   Plan Continue with current plan of care      Progression Toward Goals   Progression toward goals Progressing toward goals                SLP Short Term Goals - 07/17/21 1450       SLP SHORT TERM GOAL #1   Title Pt will recall 3 memory strategies to assist with recall of important information across 4 sessions.    Time 4    Period Weeks    Status New    Target Date 08/14/21      SLP SHORT TERM GOAL #2   Title Pt will recall 3 attention strategies to assist to increase focus and decrease cognitive fatigue across 4 sessions.    Time 4    Period Weeks    Status New    Target Date 08/14/21      SLP SHORT TERM GOAL #3   Title Pt/caregiver to  verbalize 3 overt s/sx of dysphagia/aspiration for early ID of swallowing impairment.    Time 4    Period Weeks    Status New    Target Date 08/14/21      SLP SHORT TERM GOAL #4   Title SLP to complete baseline measures for voice.    Time 4    Period Weeks    Status New    Target Date 08/14/21              SLP Long Term Goals - 07/17/21 1452       SLP LONG TERM GOAL #1   Title Pt will report succesful use of memory strategies to increase recall of important information at the end of 8 weeks.    Time 8    Period Weeks    Status New    Target Date 09/11/21      SLP LONG TERM GOAL #2   Title Pt will report the use of attention strategies to decrease cognitive load and increase focus for better participation in ADLs at the end of 8 weeks.    Time 8    Period Weeks    Status New    Target Date 09/11/21              Plan - 08/08/21 1441     Clinical Impression Statement See tx note. Pt reports he did not utilize attention  strategies at home. To complete memory strategies next session. Cont with current POC.    Speech Therapy Frequency 1x /week    Duration 8 weeks    Treatment/Interventions Patient/family education;Environmental controls;Cognitive reorganization;Compensatory techniques;Internal/external aids;SLP instruction and feedback    Potential to Achieve Goals Good    Consulted and Agree with Plan of Care Patient;Family member/caregiver    Family Member Consulted Coralyn Mark - wife             Patient will benefit from skilled therapeutic intervention in order to improve the following deficits and impairments:   Cognitive communication deficit    Problem List Patient Active Problem List   Diagnosis Date Noted   Palpitations 02/13/2014   Dyspnea 02/13/2014    Danise Mina B.S. Communication Sciences and Disorders  08/08/2021, 3:07 PM  [facility]. [place], [place], [address] Phone: [phone]   Fax:  [phone]   Name: John Mccall MRN: [id number] Date of Birth: 1956/03/15

## 2021-08-08 NOTE — Therapy (Signed)
[facility]. [place], [place], [address] Phone: [phone]   Fax:  [phone]  Physical Therapy Treatment  Patient Details  Name: John Mccall MRN: [id number] Date of Birth: [birth year] Referring Provider (PT): Rito Ehrlich, MD   Encounter Date: 08/08/2021   PT End of Session - 08/08/21 1208     Visit Number 3    Number of Visits 9    Date for PT Re-Evaluation 09/11/21    Authorization Type [employer]    PT Start Time 3009    PT Stop Time 1057    PT Time Calculation (min) 42 min    Activity Tolerance Patient tolerated treatment well             Past Medical History:  Diagnosis Date   Compression fracture of L1 lumbar vertebra ([place])    Diabetes mellitus without complication ([place])    Type II   Dysphagia, unspecified(787.20)    GERD (gastroesophageal reflux disease)    Hypertension    Parkinson's disease ([place])    Prostatitis    Sleep apnea    CPAP   Syncope and collapse    Wears hearing aid in both ears     Past Surgical History:  Procedure Laterality Date   BROW LIFT Bilateral 01/25/2021   Procedure: BLEPHAROPLASTY UPPER EYELID; W/EXCESS SKIN BROW PTOSIS REPAIR BILATERAL DIABETIC;  Surgeon: Karle Starch, MD;  Location: [facility];  Service: Ophthalmology;  Laterality: Bilateral;  Diabetic - oral meds   CARDIAC CATHETERIZATION  05/2004   [facility]    There were no vitals filed for this visit.   Subjective Assessment - 08/08/21 1014     Subjective Reports doing well, no ne pain or balance issues    Currently in Pain? No/denies                               [facility] Adult PT Treatment/Exercise - 08/08/21 0001       High Level Balance   High Level Balance Comments airex balance beam tandem walk and side stepping, on airex cone toe touches, head turns , eyes closed, walking ball toss      Lumbar Exercises: Aerobic   Nustep L5 x 5 min      Lumbar Exercises: Machines  for Strengthening   Cybex Knee Extension 10# 2x10    Cybex Knee Flexion 25# 2x10      Lumbar Exercises: Standing   Other Standing Lumbar Exercises red tband hip extension 2x10, did 1x10 for abdution this caused some sharp low back pain    Other Standing Lumbar Exercises Anti rotation green tband side stepping 3x3 with 10 sec hold; tandem stance green tband pertubations                       PT Short Term Goals - 07/17/21 1201       PT SHORT TERM GOAL #1   Title Patient to be independent with initial HEP.    Time 3    Period Weeks    Status New    Target Date 08/07/21               PT Long Term Goals - 08/08/21 1210       PT LONG TERM GOAL #1   Title Patient to be independent with advanced HEP.    Status On-going      PT LONG  TERM GOAL #2   Title Patient to demonstrate B LE strength >/=4+/5.    Status On-going      PT LONG TERM GOAL #3   Title Patient to demonstrate and report improved postural awareness at rest and with activity.    Status On-going                   Plan - 08/08/21 1209     Clinical Impression Statement Patinet did very well with the balance, only mild issues on the dynamic surfaces, Had some increased back pain with the hip abduction    PT Next Visit Plan work on back issues via flexibility and core stability, continue to work on Teacher, music and Agree with Plan of Care Patient             Patient will benefit from skilled therapeutic intervention in order to improve the following deficits and impairments:  Abnormal gait, Decreased coordination, Difficulty walking, Decreased activity tolerance, Pain, Improper body mechanics, Impaired flexibility, Decreased balance, Postural dysfunction, Decreased strength  Visit Diagnosis: Other abnormalities of gait and mobility  Abnormal posture  Muscle weakness (generalized)  Unsteadiness on feet     Problem List Patient Active Problem List   Diagnosis Date  Noted   Palpitations 02/13/2014   Dyspnea 02/13/2014    Sumner Boast, PT 08/08/2021, 12:11 PM  [facility]. [place], [place], [address] Phone: [phone]   Fax:  [phone]  Name: John Mccall MRN: [id number] Date of Birth: [birth year]

## 2021-08-14 ENCOUNTER — Ambulatory Visit: Payer: Medicare PPO | Admitting: Speech Pathology

## 2021-08-14 ENCOUNTER — Ambulatory Visit: Payer: Medicare PPO | Admitting: Physical Therapy

## 2021-08-15 ENCOUNTER — Other Ambulatory Visit: Payer: Self-pay

## 2021-08-15 ENCOUNTER — Ambulatory Visit: Payer: Medicare PPO | Admitting: Physical Therapy

## 2021-08-15 ENCOUNTER — Ambulatory Visit: Payer: Medicare PPO | Attending: Neurosurgery | Admitting: Speech Pathology

## 2021-08-15 ENCOUNTER — Encounter: Payer: Self-pay | Admitting: Physical Therapy

## 2021-08-15 DIAGNOSIS — R41841 Cognitive communication deficit: Secondary | ICD-10-CM | POA: Insufficient documentation

## 2021-08-15 DIAGNOSIS — M6281 Muscle weakness (generalized): Secondary | ICD-10-CM | POA: Insufficient documentation

## 2021-08-15 DIAGNOSIS — R293 Abnormal posture: Secondary | ICD-10-CM

## 2021-08-15 DIAGNOSIS — R2689 Other abnormalities of gait and mobility: Secondary | ICD-10-CM | POA: Diagnosis present

## 2021-08-15 DIAGNOSIS — R2681 Unsteadiness on feet: Secondary | ICD-10-CM | POA: Diagnosis present

## 2021-08-15 NOTE — Therapy (Signed)
[facility]. [place], [place], [address] Phone: [phone]   Fax:  [phone]  Physical Therapy Treatment  Patient Details  Name: John Mccall MRN: [id number] Date of Birth: October 29, 1955 Referring Provider (PT): Rito Ehrlich, MD   Encounter Date: 08/15/2021   PT End of Session - 08/15/21 1028     Visit Number 4    Number of Visits 9    Date for PT Re-Evaluation 09/11/21    Authorization Type [employer]    PT Start Time 1017    PT Stop Time 1058    PT Time Calculation (min) 41 min    Activity Tolerance Patient tolerated treatment well             Past Medical History:  Diagnosis Date   Compression fracture of L1 lumbar vertebra ([place])    Diabetes mellitus without complication ([place])    Type II   Dysphagia, unspecified(787.20)    GERD (gastroesophageal reflux disease)    Hypertension    Parkinson's disease ([place])    Prostatitis    Sleep apnea    CPAP   Syncope and collapse    Wears hearing aid in both ears     Past Surgical History:  Procedure Laterality Date   BROW LIFT Bilateral 01/25/2021   Procedure: BLEPHAROPLASTY UPPER EYELID; W/EXCESS SKIN BROW PTOSIS REPAIR BILATERAL DIABETIC;  Surgeon: Karle Starch, MD;  Location: [facility];  Service: Ophthalmology;  Laterality: Bilateral;  Diabetic - oral meds   CARDIAC CATHETERIZATION  05/2004   [facility]    There were no vitals filed for this visit.   Subjective Assessment - 08/15/21 1023     Subjective Reports that he didn't sleep well and was very active during the day, so his back is a little more sore today.    Pertinent History hx L1 compression fx, DMII, dysphagia, GERD, HTN, Parkinson's, syncope, hearing aids                               [facility] Adult PT Treatment/Exercise - 08/15/21 0001       Transfers   Transfers Sit to Stand    Number of Reps 10 reps;2 sets    Comments Holding 4 lb ball during sit to stand.       High Level Balance   High Level Balance Activities Side stepping    High Level Balance Comments B sid estep over theraband on the floor x 30 seconds, emphasized speed and large movements.      Lumbar Exercises: Seated   Other Seated Lumbar Exercises lumbothoracic extensions on mat with hands behind hips. 5 x 10 seconds      Lumbar Exercises: Supine   Clam 10 reps    Clam Limitations Red T band, 2 sets    Bridge Non-compliant;10 reps;1 second    Bridge Limitations 2 sets    Other Supine Lumbar Exercises isometric add with ball squeeze, 2 x 10      Shoulder Exercises: Standing   Extension Strengthening;Both;Theraband    Theraband Level (Shoulder Extension) Level 2 (Red)    Extension Limitations 2 x 10    Row Strengthening;Theraband;Both    Theraband Level (Shoulder Row) Level 2 (Red)    Row Limitations 2 x 10                     PT Education - 08/15/21 1032  Education Details HEP remains pertinent, encouraged him to consistentlyu perform.    Person(s) Educated Patient    Methods Explanation    Comprehension Verbalized understanding              PT Short Term Goals - 08/15/21 1047       PT SHORT TERM GOAL #1   Title Patient to be independent with initial HEP.    Time 2    Period Weeks    Status On-going    Target Date 08/22/21               PT Long Term Goals - 08/15/21 1047       PT LONG TERM GOAL #1   Title Patient to be independent with advanced HEP.    Status On-going      PT LONG TERM GOAL #2   Title Patient to demonstrate B LE strength >/=4+/5.    Status On-going      PT LONG TERM GOAL #3   Title Patient to demonstrate and report improved postural awareness at rest and with activity.    Baseline Maintains round shoulders, forward head, difficult to correct due to tightness.    Time 7    Period Weeks    Status On-going                   Plan - 08/15/21 1040     Clinical Impression Statement Patient reported  increased pain and fatigue due to yard work and lack of sleep. Treatment focused on core strength, LE strength, and balance.    Stability/Clinical Decision Making Stable/Uncomplicated    Clinical Decision Making Low    Rehab Potential Good    PT Frequency 1x / week    PT Duration Other (comment)   7w   PT Treatment/Interventions ADLs/Self Care Home Management;Canalith Repostioning;Cryotherapy;Electrical Stimulation;DME Instruction;Ultrasound;Moist Heat;Gait training;Stair training;Functional mobility training;Therapeutic activities;Therapeutic exercise;Balance training;Neuromuscular re-education;Manual techniques;Patient/family education;Passive range of motion;Dry needling;Energy conservation;Taping    PT Next Visit Plan continue to work on back issues via flexibility and core stability, continue to work on balance    Consulted and Agree with Plan of Care Patient             Patient will benefit from skilled therapeutic intervention in order to improve the following deficits and impairments:  Abnormal gait, Decreased coordination, Difficulty walking, Decreased activity tolerance, Pain, Improper body mechanics, Impaired flexibility, Decreased balance, Postural dysfunction, Decreased strength  Visit Diagnosis: Other abnormalities of gait and mobility  Abnormal posture  Muscle weakness (generalized)  Unsteadiness on feet     Problem List Patient Active Problem List   Diagnosis Date Noted   Palpitations 02/13/2014   Dyspnea 02/13/2014    Marcelina Morel, DPT 08/15/2021, 10:58 AM  [facility]. [place], [place], [address] Phone: [phone]   Fax:  [phone]  Name: John Mccall MRN: [id number] Date of Birth: 06-29-1956

## 2021-08-15 NOTE — Therapy (Signed)
[place]. [place], [place], [address] Phone: [phone]   Fax:  [phone]  Speech Language Pathology Treatment  Patient Details  Name: John Mccall MRN: [id number] Date of Birth: August 23, 1956 Referring Provider (SLP): Rito Ehrlich MD   Encounter Date: 08/15/2021   End of Session - 08/15/21 1055     Visit Number 5    Number of Visits 9    Date for SLP Re-Evaluation 09/16/21    SLP Start Time 15    SLP Stop Time  1135    SLP Time Calculation (min) 40 min    Activity Tolerance Patient tolerated treatment well             Past Medical History:  Diagnosis Date   Compression fracture of L1 lumbar vertebra ([place])    Diabetes mellitus without complication ([place])    Type II   Dysphagia, unspecified(787.20)    GERD (gastroesophageal reflux disease)    Hypertension    Parkinson's disease ([place])    Prostatitis    Sleep apnea    CPAP   Syncope and collapse    Wears hearing aid in both ears     Past Surgical History:  Procedure Laterality Date   BROW LIFT Bilateral 01/25/2021   Procedure: BLEPHAROPLASTY UPPER EYELID; W/EXCESS SKIN BROW PTOSIS REPAIR BILATERAL DIABETIC;  Surgeon: Karle Starch, MD;  Location: [facility];  Service: Ophthalmology;  Laterality: Bilateral;  Diabetic - oral meds   CARDIAC CATHETERIZATION  05/2004   [facility]    There were no vitals filed for this visit.   Subjective Assessment - 08/15/21 1055     Subjective "Things have been going good."    Currently in Pain? No/denies                   ADULT SLP TREATMENT - 08/15/21 1110       General Information   Behavior/Cognition Cooperative;Pleasant mood      Treatment Provided   Treatment provided Cognitive-Linquistic      Cognitive-Linquistic Treatment   Treatment focused on Cognition    Skilled Treatment SLP completed training in attention strategies. Pt reported he feels like he uses most of these skills  and is typically organized. He also reports being "a planner". Pt reports he could benefit from asking clarification in conversations to decrease conversation breakdowns. Pt reports he feels like his "weakest point" is memory. Began training on memory strategies this session.      Assessment / Recommendations / Plan   Plan Continue with current plan of care      Progression Toward Goals   Progression toward goals Progressing toward goals                SLP Short Term Goals - 08/15/21 1108       SLP SHORT TERM GOAL #1   Title Pt will recall 3 memory strategies to assist with recall of important information across 4 sessions.    Time 4    Period Weeks    Status On-going    Target Date 08/14/21      SLP SHORT TERM GOAL #2   Title Pt will recall 3 attention strategies to assist to increase focus and decrease cognitive fatigue across 4 sessions.    Time 4    Period Weeks    Status Achieved    Target Date 08/14/21      SLP SHORT TERM GOAL #3   Title Pt/caregiver to  verbalize 3 overt s/sx of dysphagia/aspiration for early ID of swallowing impairment.    Time 4    Period Weeks    Status On-going    Target Date 08/14/21      SLP SHORT TERM GOAL #4   Title SLP to complete baseline measures for voice.    Time 4    Period Weeks    Status Achieved    Target Date 08/14/21              SLP Long Term Goals - 07/17/21 1452       SLP LONG TERM GOAL #1   Title Pt will report succesful use of memory strategies to increase recall of important information at the end of 8 weeks.    Time 8    Period Weeks    Status New    Target Date 09/11/21      SLP LONG TERM GOAL #2   Title Pt will report the use of attention strategies to decrease cognitive load and increase focus for better participation in ADLs at the end of 8 weeks.    Time 8    Period Weeks    Status New    Target Date 09/11/21              Plan - 08/15/21 1056     Clinical Impression Statement See tx  note. Pt reports, "I don't feel as sharp as I used to" and provided an example of when he went into the grocery store, forgot the list, and couldn't recall what he needed.  Cont with current POC.    Speech Therapy Frequency 1x /week    Duration 8 weeks    Treatment/Interventions Patient/family education;Environmental controls;Cognitive reorganization;Compensatory techniques;Internal/external aids;SLP instruction and feedback    Potential to Achieve Goals Good    Consulted and Agree with Plan of Care Patient;Family member/caregiver    Family Member Consulted Coralyn Mark - wife             Patient will benefit from skilled therapeutic intervention in order to improve the following deficits and impairments:   Cognitive communication deficit    Problem List Patient Active Problem List   Diagnosis Date Noted   Palpitations 02/13/2014   Dyspnea 02/13/2014    Verdene Lennert MS, [place], CBIS  08/15/2021, 11:16 AM  [place]. [place], [place], [address] Phone: [phone]   Fax:  [phone]   Name: ALECK LOCKLIN MRN: [id number] Date of Birth: 28-Aug-1956

## 2021-08-21 ENCOUNTER — Ambulatory Visit: Payer: Medicare PPO | Admitting: Physical Therapy

## 2021-08-21 ENCOUNTER — Ambulatory Visit: Payer: Medicare PPO | Admitting: Speech Pathology

## 2021-08-22 ENCOUNTER — Ambulatory Visit: Payer: Medicare PPO | Admitting: Physical Therapy

## 2021-08-22 ENCOUNTER — Ambulatory Visit: Payer: Medicare PPO | Admitting: Speech Pathology

## 2021-08-22 ENCOUNTER — Encounter: Payer: Self-pay | Admitting: Physical Therapy

## 2021-08-22 ENCOUNTER — Other Ambulatory Visit: Payer: Self-pay

## 2021-08-22 ENCOUNTER — Encounter: Payer: Self-pay | Admitting: Speech Pathology

## 2021-08-22 DIAGNOSIS — R2689 Other abnormalities of gait and mobility: Secondary | ICD-10-CM

## 2021-08-22 DIAGNOSIS — R41841 Cognitive communication deficit: Secondary | ICD-10-CM

## 2021-08-22 DIAGNOSIS — M6281 Muscle weakness (generalized): Secondary | ICD-10-CM

## 2021-08-22 DIAGNOSIS — R2681 Unsteadiness on feet: Secondary | ICD-10-CM

## 2021-08-22 DIAGNOSIS — R293 Abnormal posture: Secondary | ICD-10-CM

## 2021-08-22 NOTE — Patient Instructions (Signed)
Access Code: [email or web address] URL: [email or web address] Date: 08/22/2021 Prepared by: Ethel Rana  Exercises Standing Trunk Rotation with Resistance - 1 x daily - 7 x weekly - 2 sets - 10 reps Standing Shoulder Row with Anchored Resistance - 1 x daily - 7 x weekly - 2 sets - 15 reps Standing Shoulder Horizontal Abduction with Resistance - 1 x daily - 7 x weekly - 2 sets - 15 reps Shoulder Extension with Resistance - 1 x daily - 7 x weekly - 2 sets - 15 reps Shoulder External Rotation and Scapular Retraction with Resistance - 1 x daily - 7 x weekly - 2 sets - 15 reps Standing Trunk Rotation with Resistance - 1 x daily - 7 x weekly - 2 sets - 10 reps Standing Thoracic Extension at Wall - 1 x daily - 7 x weekly - 5 reps - 10 hold

## 2021-08-22 NOTE — Therapy (Signed)
[place]. [place], [place], [address] Phone: [phone]   Fax:  [phone]  Physical Therapy Treatment  Patient Details  Name: John Mccall MRN: [id number] Date of Birth: 12-09-1955 Referring Provider (PT): Rito Ehrlich, MD   Encounter Date: 08/22/2021   PT End of Session - 08/22/21 1102     Visit Number 5    Number of Visits 9    Date for PT Re-Evaluation 09/11/21    PT Start Time 1018    PT Stop Time 1101    PT Time Calculation (min) 43 min    Activity Tolerance Patient tolerated treatment well             Past Medical History:  Diagnosis Date   Compression fracture of L1 lumbar vertebra ([place])    Diabetes mellitus without complication ([place])    Type II   Dysphagia, unspecified(787.20)    GERD (gastroesophageal reflux disease)    Hypertension    Parkinson's disease ([place])    Prostatitis    Sleep apnea    CPAP   Syncope and collapse    Wears hearing aid in both ears     Past Surgical History:  Procedure Laterality Date   BROW LIFT Bilateral 01/25/2021   Procedure: BLEPHAROPLASTY UPPER EYELID; W/EXCESS SKIN BROW PTOSIS REPAIR BILATERAL DIABETIC;  Surgeon: Karle Starch, MD;  Location: [facility];  Service: Ophthalmology;  Laterality: Bilateral;  Diabetic - oral meds   CARDIAC CATHETERIZATION  05/2004   [facility]    There were no vitals filed for this visit.   Subjective Assessment - 08/22/21 1022     Subjective Reports general pain, mostly in his shoulders.    Patient is accompained by: Family member   wife   Pertinent History hx L1 compression fx, DMII, dysphagia, GERD, HTN, Parkinson's, syncope, hearing aids    Limitations Lifting;Standing;Walking;House hold activities    How long can you stand comfortably? variable    How long can you walk comfortably? variable- able to walk an hour or more, limited d/t fatigue    Diagnostic tests 11/18/13 lumbar xray: Chronic L1 compression  deformity by evidence of acute osseous abnormalities. Spondylosis at the L5-S1 level.    Patient Stated Goals work on strength and walking                               [facility] Adult PT Treatment/Exercise - 08/22/21 0001       Lumbar Exercises: Aerobic   Recumbent Bike 1.5 x 5 minutes      Shoulder Exercises: Standing   Horizontal ABduction Limitations seated, yellow theraband, 2 x 15    Extension Strengthening    Theraband Level (Shoulder Extension) Level 1 (Yellow)    Extension Limitations 2 x 15    Theraband Level (Shoulder Row) Level 1 (Yellow)    Row Limitations 2 x 15    Other Standing Exercises Resisted rotation yellow theraband 2 x 15    Other Standing Exercises Back to wall, place elbows on wall at 90 degrees, ER to try to touch wall with hands, 2 x 10 with 5 second holds. Face wall wit helbows against the wall, push hips to wall to increase lumbar lordosis and thoracic ext, hold x 5 sec, x 5 reps.                     PT Education - 08/22/21  1059     Education Details UPdated HEP, emphasis on upright posture to decrease pain.    Person(s) Educated Patient    Methods Explanation;Demonstration;Handout    Comprehension Returned demonstration;Verbalized understanding              PT Short Term Goals - 08/22/21 1101       PT SHORT TERM GOAL #1   Title Patient to be independent with initial HEP.    Time 1    Period Weeks    Status On-going    Target Date 08/29/21               PT Long Term Goals - 08/15/21 1047       PT LONG TERM GOAL #1   Title Patient to be independent with advanced HEP.    Status On-going      PT LONG TERM GOAL #2   Title Patient to demonstrate B LE strength >/=4+/5.    Status On-going      PT LONG TERM GOAL #3   Title Patient to demonstrate and report improved postural awareness at rest and with activity.    Baseline Maintains round shoulders, forward head, difficult to correct due to tightness.     Time 7    Period Weeks    Status On-going                   Plan - 08/22/21 1059     Clinical Impression Statement Patient reports more shoulder pain today, emphasized scapular strengthening, shoulder girdle stability, postual activites to increase thoraci ext.    Personal Factors and Comorbidities Age;Fitness;Comorbidity 3+;Time since onset of injury/illness/exacerbation;Past/Current Experience    Comorbidities hx L1 compression fx, DMII, dysphagia, GERD, HTN, Parkinson's, syncope, hearing aids    Examination-Activity Limitations Bathing;Locomotion Level;Transfers;Reach Overhead;Bed Mobility;Bend;Sit;Carry;Squat;Dressing;Stairs;Stand;Hygiene/Grooming;Lift    Examination-Participation Restrictions [employer];Shop;Community Activity;Cleaning;Church;Meal Prep    Stability/Clinical Decision Making Stable/Uncomplicated    Clinical Decision Making Low    Rehab Potential Good    PT Frequency 1x / week    PT Duration 6 weeks   7w   PT Treatment/Interventions ADLs/Self Care Home Management;Canalith Repostioning;Cryotherapy;Electrical Stimulation;DME Instruction;Ultrasound;Moist Heat;Gait training;Stair training;Functional mobility training;Therapeutic activities;Therapeutic exercise;Balance training;Neuromuscular re-education;Manual techniques;Patient/family education;Passive range of motion;Dry needling;Energy conservation;Taping    PT Next Visit Plan continue to work on back issues via flexibility and core stability, continue to work on balance    PT [place] and Agree with Plan of Care Patient             Patient will benefit from skilled therapeutic intervention in order to improve the following deficits and impairments:  Abnormal gait, Decreased coordination, Difficulty walking, Decreased activity tolerance, Pain, Improper body mechanics, Impaired flexibility, Decreased balance, Postural dysfunction, Decreased strength  Visit  Diagnosis: Other abnormalities of gait and mobility  Abnormal posture  Muscle weakness (generalized)  Unsteadiness on feet     Problem List Patient Active Problem List   Diagnosis Date Noted   Palpitations 02/13/2014   Dyspnea 02/13/2014    Marcelina Morel, DPT 08/22/2021, 11:03 AM  [place]. [place], [place], [address] Phone: [phone]   Fax:  [phone]  Name: John Mccall MRN: [id number] Date of Birth: [birth year]

## 2021-08-22 NOTE — Therapy (Signed)
[place]. [place], [place], [address] Phone: [phone]   Fax:  [phone]  Speech Language Pathology Treatment  Patient Details  Name: John Mccall MRN: [id number] Date of Birth: 1956-07-09 Referring Provider (SLP): Rito Ehrlich MD   Encounter Date: 08/22/2021   End of Session - 08/22/21 1114     Visit Number 6    Number of Visits 9    Date for SLP Re-Evaluation 09/16/21    SLP Start Time 1100    SLP Stop Time  2947    SLP Time Calculation (min) 45 min    Activity Tolerance Patient tolerated treatment well             Past Medical History:  Diagnosis Date   Compression fracture of L1 lumbar vertebra ([place])    Diabetes mellitus without complication ([place])    Type II   Dysphagia, unspecified(787.20)    GERD (gastroesophageal reflux disease)    Hypertension    Parkinson's disease ([place])    Prostatitis    Sleep apnea    CPAP   Syncope and collapse    Wears hearing aid in both ears     Past Surgical History:  Procedure Laterality Date   BROW LIFT Bilateral 01/25/2021   Procedure: BLEPHAROPLASTY UPPER EYELID; W/EXCESS SKIN BROW PTOSIS REPAIR BILATERAL DIABETIC;  Surgeon: Karle Starch, MD;  Location: [facility];  Service: Ophthalmology;  Laterality: Bilateral;  Diabetic - oral meds   CARDIAC CATHETERIZATION  05/2004   [facility]    There were no vitals filed for this visit.   Subjective Assessment - 08/22/21 1108     Subjective "I have done a few things."    Currently in Pain? No/denies                   ADULT SLP TREATMENT - 08/22/21 1120       General Information   Behavior/Cognition Cooperative;Pleasant mood      Treatment Provided   Treatment provided Cognitive-Linquistic      Cognitive-Linquistic Treatment   Treatment focused on Cognition    Skilled Treatment SLP completed training in internal memory strategies. Lanny Hurst reports he has been using lists and  clarfication strategies at home. SLP and pt facilitated use of internal memory strategies by using repetition and categorization to assist with recall of information. Pt required occasional minA to complete task.      Assessment / Recommendations / Plan   Plan Continue with current plan of care      Progression Toward Goals   Progression toward goals Progressing toward goals                SLP Short Term Goals - 08/22/21 1115       SLP SHORT TERM GOAL #1   Title Pt will recall 3 memory strategies to assist with recall of important information across 4 sessions.    Time 4    Period Weeks    Status Achieved    Target Date 08/14/21      SLP SHORT TERM GOAL #2   Title Pt will recall 3 attention strategies to assist to increase focus and decrease cognitive fatigue across 4 sessions.    Time 4    Period Weeks    Status Achieved    Target Date 08/14/21      SLP SHORT TERM GOAL #3   Title Pt/caregiver to verbalize 3 overt s/sx of dysphagia/aspiration for early ID of swallowing  impairment.    Time 4    Period Weeks    Status Achieved    Target Date 08/14/21      SLP SHORT TERM GOAL #4   Title SLP to complete baseline measures for voice.    Time 4    Period Weeks    Status Achieved    Target Date 08/14/21              SLP Long Term Goals - 07/17/21 1452       SLP LONG TERM GOAL #1   Title Pt will report succesful use of memory strategies to increase recall of important information at the end of 8 weeks.    Time 8    Period Weeks    Status New    Target Date 09/11/21      SLP LONG TERM GOAL #2   Title Pt will report the use of attention strategies to decrease cognitive load and increase focus for better participation in ADLs at the end of 8 weeks.    Time 8    Period Weeks    Status New    Target Date 09/11/21              Plan - 08/22/21 1114     Clinical Impression Statement See tx note.  Cont with current POC.    Speech Therapy Frequency 1x /week     Duration 8 weeks    Treatment/Interventions Patient/family education;Environmental controls;Cognitive reorganization;Compensatory techniques;Internal/external aids;SLP instruction and feedback    Potential to Achieve Goals Good    Consulted and Agree with Plan of Care Patient;Family member/caregiver    Family Member Consulted Coralyn Mark - wife             Patient will benefit from skilled therapeutic intervention in order to improve the following deficits and impairments:   Cognitive communication deficit    Problem List Patient Active Problem List   Diagnosis Date Noted   Palpitations 02/13/2014   Dyspnea 02/13/2014    Verdene Lennert MS, [place], CBIS  08/22/2021, 11:25 AM  [place]. [place], [place], [address] Phone: [phone]   Fax:  [phone]   Name: ALONSO GAPINSKI MRN: [id number] Date of Birth: [birth year]

## 2021-08-28 ENCOUNTER — Ambulatory Visit: Payer: Medicare PPO | Admitting: Speech Pathology

## 2021-08-28 ENCOUNTER — Ambulatory Visit: Payer: Medicare PPO | Admitting: Physical Therapy

## 2021-08-29 ENCOUNTER — Encounter: Payer: Self-pay | Admitting: Physical Therapy

## 2021-08-29 ENCOUNTER — Ambulatory Visit: Payer: Medicare PPO | Admitting: Speech Pathology

## 2021-08-29 ENCOUNTER — Other Ambulatory Visit: Payer: Self-pay

## 2021-08-29 ENCOUNTER — Ambulatory Visit: Payer: Medicare PPO | Admitting: Physical Therapy

## 2021-08-29 ENCOUNTER — Encounter: Payer: Self-pay | Admitting: Speech Pathology

## 2021-08-29 DIAGNOSIS — R2689 Other abnormalities of gait and mobility: Secondary | ICD-10-CM

## 2021-08-29 DIAGNOSIS — R41841 Cognitive communication deficit: Secondary | ICD-10-CM | POA: Diagnosis not present

## 2021-08-29 DIAGNOSIS — R2681 Unsteadiness on feet: Secondary | ICD-10-CM

## 2021-08-29 DIAGNOSIS — R293 Abnormal posture: Secondary | ICD-10-CM

## 2021-08-29 DIAGNOSIS — M6281 Muscle weakness (generalized): Secondary | ICD-10-CM

## 2021-08-29 NOTE — Patient Instructions (Signed)
Access Code: [email or web address] URL: [email or web address] Date: 08/29/2021 Prepared by: Ethel Rana  Exercises Corner Pec Major Stretch - 1 x daily - 7 x weekly - 1 sets - 3 reps - 20 hold Doorway Pec Stretch at 90 Degrees Abduction - 1 x daily - 7 x weekly - 1 sets - 3 reps - 20 hold

## 2021-08-29 NOTE — Therapy (Signed)
[place]. [place], [place], [address] Phone: [phone]   Fax:  [phone]  August 29, 2021   No Recipients  Physical Therapy Discharge Summary  Patient: John Mccall  MRN: [id number]  Date of Birth: August 30, 1956   Diagnosis: Other abnormalities of gait and mobility  Abnormal posture  Muscle weakness (generalized)  Unsteadiness on feet Referring Provider (PT): Rito Ehrlich, MD   The above patient had been seen in Physical Therapy 6 times of 6 treatments scheduled with 0 no shows and 0 cancellations.  The treatment consisted of Strength, balance, and postural control activities. The patient is: Improved  Subjective: Patient feels he is near baseline.  Discharge Findings: Patient demonstrates large improvements in balance and mobility. His posture remains flexed with round shoulders. HEP developed to continue to address posture.  Functional Status at Discharge: I with all functional mobility.  Goals Partially Met   Plan - 08/29/21 1127     Clinical Impression Statement Patient's functional status re-assessed, final updates to HEP provided, as patient reports he will not be able to make it to his final scheduled visit. His balance has improved significantly. His posture remains flexed, with HEP to continue to adress strength and mobility, posture.    Personal Factors and Comorbidities Age;Fitness;Comorbidity 3+;Time since onset of injury/illness/exacerbation;Past/Current Experience    Comorbidities hx L1 compression fx, DMII, dysphagia, GERD, HTN, Parkinson's, syncope, hearing aids    Examination-Activity Limitations Bathing;Locomotion Level;Transfers;Reach Overhead;Bed Mobility;Bend;Sit;Carry;Squat;Dressing;Stairs;Stand;Hygiene/Grooming;Lift    Examination-Participation Restrictions [employer];Shop;Community Activity;Cleaning;Church;Meal Prep    Stability/Clinical Decision Making  Stable/Uncomplicated    Rehab Potential Good    PT Frequency 1x / week    PT Duration 6 weeks   7w   PT Treatment/Interventions ADLs/Self Care Home Management;Canalith Repostioning;Cryotherapy;Electrical Stimulation;DME Instruction;Ultrasound;Moist Heat;Gait training;Stair training;Functional mobility training;Therapeutic activities;Therapeutic exercise;Balance training;Neuromuscular re-education;Manual techniques;Patient/family education;Passive range of motion;Dry needling;Energy conservation;Taping    PT Next Visit Plan continue to work on back issues via flexibility and core stability, continue to work on balance    PT [place], corner stretches    Consulted and Agree with Plan of Care Patient             Sincerely,   Marcelina Morel, DPT   CC No Recipients  [place]. [place], [place], [address] Phone: [phone]   Fax:  [phone]  Patient: John Mccall  MRN: [id number]  Date of Birth: 1956-09-24

## 2021-08-29 NOTE — Therapy (Addendum)
[place] [place], [place], [address] Phone: [phone]   Fax:  [phone]  Speech Language Pathology Treatment & Discharge Summary  Patient Details  Name: John Mccall MRN: [id number] Date of Birth: 02-Jun-1956 Referring Provider (SLP): Rito Ehrlich MD   Encounter Date: 08/29/2021   End of Session - 08/29/21 1120     Visit Number 7    Number of Visits 9    Date for SLP Re-Evaluation 09/16/21    SLP Start Time 68    SLP Stop Time  1140    SLP Time Calculation (min) 40 min    Activity Tolerance Patient tolerated treatment well             Past Medical History:  Diagnosis Date   Compression fracture of L1 lumbar vertebra ([place])    Diabetes mellitus without complication ([place])    Type II   Dysphagia, unspecified(787.20)    GERD (gastroesophageal reflux disease)    Hypertension    Parkinson's disease ([place])    Prostatitis    Sleep apnea    CPAP   Syncope and collapse    Wears hearing aid in both ears     Past Surgical History:  Procedure Laterality Date   BROW LIFT Bilateral 01/25/2021   Procedure: BLEPHAROPLASTY UPPER EYELID; W/EXCESS SKIN BROW PTOSIS REPAIR BILATERAL DIABETIC;  Surgeon: Karle Starch, MD;  Location: [facility];  Service: Ophthalmology;  Laterality: Bilateral;  Diabetic - oral meds   CARDIAC CATHETERIZATION  05/2004   [facility]    There were no vitals filed for this visit.   Subjective Assessment - 08/29/21 1119     Subjective "I am going to the beach."    Currently in Pain? No/denies                   ADULT SLP TREATMENT - 08/29/21 1540       General Information   Behavior/Cognition Cooperative;Pleasant mood      Treatment Provided   Treatment provided Cognitive-Linquistic      Cognitive-Linquistic Treatment   Treatment focused on Cognition    Skilled Treatment SLP facilitated increased attention and memory by using PQRST method. Pt was  instructed to read different flyers for reading comprehension ([facility] speech therapy). Pt was instructed to "preview", "read" for content, "state" information, and then was "tested" by answering questions about the flyers. Pt required minA to scan full page for all information. Pt reported he is going to be out of town and would like to d/c from therapy early. SLP set pt up for a PD screen in May (6 months out) to determine need for services. Pt in agreement. Pt has met all goals and reports he has benefited from [facility] services. Voice remains WFL.      Assessment / Recommendations / Plan   Plan Continue with current plan of care      Progression Toward Goals   Progression toward goals Progressing toward goals                SLP Short Term Goals - 08/22/21 1115       SLP SHORT TERM GOAL #1   Title Pt will recall 3 memory strategies to assist with recall of important information across 4 sessions.    Time 4    Period Weeks    Status Achieved    Target Date 08/14/21      SLP SHORT TERM GOAL #2   Title  Pt will recall 3 attention strategies to assist to increase focus and decrease cognitive fatigue across 4 sessions.    Time 4    Period Weeks    Status Achieved    Target Date 08/14/21      SLP SHORT TERM GOAL #3   Title Pt/caregiver to verbalize 3 overt s/sx of dysphagia/aspiration for early ID of swallowing impairment.    Time 4    Period Weeks    Status Achieved    Target Date 08/14/21      SLP SHORT TERM GOAL #4   Title SLP to complete baseline measures for voice.    Time 4    Period Weeks    Status Achieved    Target Date 08/14/21              SLP Long Term Goals - 08/29/21 1121       SLP LONG TERM GOAL #1   Title Pt will report succesful use of memory strategies to increase recall of important information at the end of 8 weeks.    Time 8    Period Weeks    Status Achieved      SLP LONG TERM GOAL #2   Title Pt will report the use of attention strategies  to decrease cognitive load and increase focus for better participation in ADLs at the end of 8 weeks.    Time 8    Period Weeks    Status Achieved              Plan - 08/29/21 1120     Clinical Impression Statement See tx note.  Pt is to d/c this section. SLP scheduled pt for a "PD screen", 6 months out. Pt was in agreement. At this time, voice remains [facility].    Speech Therapy Frequency 1x /week    Duration 8 weeks    Treatment/Interventions Patient/family education;Environmental controls;Cognitive reorganization;Compensatory techniques;Internal/external aids;SLP instruction and feedback    Potential to Achieve Goals Good    Consulted and Agree with Plan of Care Patient;Family member/caregiver    Family Member Consulted Coralyn Mark - wife             Patient will benefit from skilled therapeutic intervention in order to improve the following deficits and impairments:   Cognitive communication deficit    Problem List Patient Active Problem List   Diagnosis Date Noted   Palpitations 02/13/2014   Dyspnea 02/13/2014   SPEECH THERAPY DISCHARGE SUMMARY  Visits from Start of Care: 7  Current functional level related to goals / functional outcomes: Pt has met all goals. He reports success with strategy use at home.   Remaining deficits: Mild memory deficits.    Education / Equipment: Edu completed. Pt to follow up in 6 months for PD screen.   Patient agrees to discharge. Patient goals were met. Patient is being discharged due to the patient's request.  And meeting of all goals.    [place], [place], CBIS  08/29/2021, 3:47 PM  [place]. [place], [place], [address] Phone: [phone]   Fax:  [phone]   Name: John Mccall MRN: [id number] Date of Birth: 1956-07-10

## 2021-09-03 ENCOUNTER — Ambulatory Visit: Payer: Medicare PPO | Admitting: Physical Therapy

## 2021-09-03 ENCOUNTER — Ambulatory Visit: Payer: Medicare PPO | Admitting: Speech Pathology

## 2021-09-04 ENCOUNTER — Ambulatory Visit: Payer: Medicare PPO | Admitting: Physical Therapy

## 2021-09-04 ENCOUNTER — Ambulatory Visit: Payer: Medicare PPO | Admitting: Speech Pathology

## 2021-09-10 ENCOUNTER — Ambulatory Visit: Payer: Medicare PPO | Admitting: Physical Therapy

## 2021-09-10 ENCOUNTER — Ambulatory Visit: Payer: Medicare PPO | Admitting: Speech Pathology

## 2021-09-11 ENCOUNTER — Ambulatory Visit: Payer: Medicare PPO | Admitting: Speech Pathology

## 2021-09-11 ENCOUNTER — Ambulatory Visit: Payer: Medicare PPO | Admitting: Physical Therapy

## 2022-05-20 ENCOUNTER — Other Ambulatory Visit: Payer: Self-pay | Admitting: Gastroenterology

## 2022-05-20 ENCOUNTER — Other Ambulatory Visit (HOSPITAL_COMMUNITY): Payer: Self-pay | Admitting: Gastroenterology

## 2022-05-20 DIAGNOSIS — R1012 Left upper quadrant pain: Secondary | ICD-10-CM

## 2022-05-20 DIAGNOSIS — R140 Abdominal distension (gaseous): Secondary | ICD-10-CM

## 2022-05-27 ENCOUNTER — Ambulatory Visit
Admission: RE | Admit: 2022-05-27 | Discharge: 2022-05-27 | Disposition: A | Discharge status: Home / Self Care | Payer: Medicare PPO | Source: Ambulatory Visit | Attending: Gastroenterology | Admitting: Gastroenterology

## 2022-05-27 DIAGNOSIS — R140 Abdominal distension (gaseous): Secondary | ICD-10-CM | POA: Insufficient documentation

## 2022-05-27 DIAGNOSIS — R1012 Left upper quadrant pain: Secondary | ICD-10-CM | POA: Insufficient documentation

## 2022-05-27 LAB — POCT I-STAT CREATININE: Creatinine, Ser: 0.9 mg/dL (ref 0.61–1.24)

## 2022-05-27 MED ORDER — IOHEXOL 300 MG/ML  SOLN
100.0000 mL | Freq: Once | INTRAMUSCULAR | Status: AC | PRN
  Administered 2022-05-27: 100 mL via INTRAVENOUS

## 2022-07-30 ENCOUNTER — Ambulatory Visit
Admission: RE | Admit: 2022-07-30 | Discharge: 2022-07-30 | Disposition: A | Discharge status: Home / Self Care | Payer: Medicare PPO | Attending: Internal Medicine | Admitting: Internal Medicine

## 2022-07-30 ENCOUNTER — Ambulatory Visit: Payer: Medicare PPO | Admitting: Anesthesiology

## 2022-07-30 ENCOUNTER — Encounter
Admission: RE | Disposition: A | Discharge status: Home / Self Care | Payer: Self-pay | Source: Home / Self Care | Attending: Internal Medicine

## 2022-07-30 ENCOUNTER — Other Ambulatory Visit: Payer: Self-pay

## 2022-07-30 ENCOUNTER — Encounter: Payer: Self-pay | Admitting: Internal Medicine

## 2022-07-30 DIAGNOSIS — K640 First degree hemorrhoids: Secondary | ICD-10-CM | POA: Diagnosis not present

## 2022-07-30 DIAGNOSIS — Z1211 Encounter for screening for malignant neoplasm of colon: Secondary | ICD-10-CM | POA: Diagnosis present

## 2022-07-30 DIAGNOSIS — K219 Gastro-esophageal reflux disease without esophagitis: Secondary | ICD-10-CM | POA: Diagnosis not present

## 2022-07-30 DIAGNOSIS — K5900 Constipation, unspecified: Secondary | ICD-10-CM | POA: Diagnosis not present

## 2022-07-30 DIAGNOSIS — Z79899 Other long term (current) drug therapy: Secondary | ICD-10-CM | POA: Diagnosis not present

## 2022-07-30 DIAGNOSIS — K319 Disease of stomach and duodenum, unspecified: Secondary | ICD-10-CM | POA: Diagnosis not present

## 2022-07-30 DIAGNOSIS — R1012 Left upper quadrant pain: Secondary | ICD-10-CM | POA: Diagnosis not present

## 2022-07-30 DIAGNOSIS — D123 Benign neoplasm of transverse colon: Secondary | ICD-10-CM | POA: Diagnosis not present

## 2022-07-30 DIAGNOSIS — K5730 Diverticulosis of large intestine without perforation or abscess without bleeding: Secondary | ICD-10-CM | POA: Diagnosis not present

## 2022-07-30 DIAGNOSIS — K2960 Other gastritis without bleeding: Secondary | ICD-10-CM | POA: Diagnosis not present

## 2022-07-30 DIAGNOSIS — E119 Type 2 diabetes mellitus without complications: Secondary | ICD-10-CM | POA: Insufficient documentation

## 2022-07-30 DIAGNOSIS — G20A1 Parkinson's disease without dyskinesia, without mention of fluctuations: Secondary | ICD-10-CM | POA: Diagnosis not present

## 2022-07-30 DIAGNOSIS — I10 Essential (primary) hypertension: Secondary | ICD-10-CM | POA: Diagnosis not present

## 2022-07-30 HISTORY — PX: COLONOSCOPY: SHX5424

## 2022-07-30 HISTORY — PX: ESOPHAGOGASTRODUODENOSCOPY: SHX5428

## 2022-07-30 LAB — GLUCOSE, CAPILLARY: Glucose-Capillary: 129 mg/dL — ABNORMAL HIGH (ref 70–99)

## 2022-07-30 SURGERY — COLONOSCOPY
Anesthesia: General

## 2022-07-30 MED ORDER — EPHEDRINE SULFATE (PRESSORS) 50 MG/ML IJ SOLN
INTRAMUSCULAR | Status: DC | PRN
  Administered 2022-07-30: 15 mg via INTRAVENOUS
  Administered 2022-07-30: 10 mg via INTRAVENOUS

## 2022-07-30 MED ORDER — STERILE WATER FOR IRRIGATION IR SOLN
Status: DC | PRN
  Administered 2022-07-30: 60 mL

## 2022-07-30 MED ORDER — PROPOFOL 500 MG/50ML IV EMUL
INTRAVENOUS | Status: DC | PRN
  Administered 2022-07-30: 100 ug/kg/min via INTRAVENOUS

## 2022-07-30 MED ORDER — PROPOFOL 10 MG/ML IV BOLUS
INTRAVENOUS | Status: DC | PRN
  Administered 2022-07-30: 20 mg via INTRAVENOUS
  Administered 2022-07-30: 50 mg via INTRAVENOUS

## 2022-07-30 MED ORDER — LIDOCAINE HCL (CARDIAC) PF 100 MG/5ML IV SOSY
PREFILLED_SYRINGE | INTRAVENOUS | Status: DC | PRN
  Administered 2022-07-30: 80 mg via INTRAVENOUS

## 2022-07-30 MED ORDER — SODIUM CHLORIDE 0.9 % IV SOLN: INTRAVENOUS | Status: DC

## 2022-07-30 NOTE — Op Note (Signed)
[facility] Gastroenterology Patient Name: John Mccall Procedure Date: 07/30/2022 10:40 AM MRN: [id number] Account #: [id number] Date of Birth: 1955/11/08 Admit Type: Outpatient Age: 66 Room: [facility] ENDO ROOM 2 Gender: Male Note Status: Finalized Instrument Name: Upper Endoscope [id number] Procedure:             Upper GI endoscopy Indications:           Abdominal pain in the left upper quadrant Providers:             Lorie Apley K. Padraig Nhan MD, MD Medicines:             Propofol per Anesthesia Complications:         No immediate complications. Procedure:             Pre-Anesthesia Assessment:                        - The risks and benefits of the procedure and the                         sedation options and risks were discussed with the                         patient. All questions were answered and informed                         consent was obtained.                        - Patient identification and proposed procedure were                         verified prior to the procedure by the nurse. The                         procedure was verified in the procedure room.                        - ASA Grade Assessment: III - A patient with severe                         systemic disease.                        - After reviewing the risks and benefits, the patient                         was deemed in satisfactory condition to undergo the                         procedure.                        After obtaining informed consent, the endoscope was                         passed under direct vision. Throughout the procedure,                         the patient's blood pressure, pulse, and oxygen  saturations were monitored continuously. The Endoscope                         was introduced through the mouth, and advanced to the                         third part of duodenum. The upper GI endoscopy was                         accomplished without  difficulty. The patient tolerated                         the procedure well. Findings:      The esophagus was normal.      Localized moderate inflammation characterized by erosions and erythema       was found in the gastric antrum. Biopsies were taken with a cold forceps       for Helicobacter pylori testing.      The cardia and gastric fundus were normal on retroflexion.      The examined duodenum was normal.      The exam was otherwise without abnormality. Impression:            - Normal esophagus.                        - Gastritis. Biopsied.                        - Normal examined duodenum.                        - The examination was otherwise normal. Recommendation:        - Await pathology results.                        - Proceed with colonoscopy Procedure Code(s):     --- Professional ---                        [phone], Esophagogastroduodenoscopy, flexible,                         transoral; with biopsy, single or multiple Diagnosis Code(s):     --- Professional ---                        R10.12, Left upper quadrant pain                        K29.70, Gastritis, unspecified, without bleeding CPT copyright 2019 American Medical Association. All rights reserved. The codes documented in this report are preliminary and upon coder review may  be revised to meet current compliance requirements. [name] MD, MD 07/30/2022 11:04:01 AM This report has been signed electronically. Number of Addenda: 0 Note Initiated On: 07/30/2022 10:40 AM Estimated Blood Loss:  Estimated blood loss: none.      [facility]

## 2022-07-30 NOTE — Anesthesia Preprocedure Evaluation (Signed)
Anesthesia Evaluation  Patient identified by MRN, date of birth, ID band Patient awake    Reviewed: Allergy & Precautions, H&P , NPO status , Patient's Chart, lab work & pertinent test results, reviewed documented beta blocker date and time   Airway Mallampati: II   Neck ROM: full    Dental  (+) Poor Dentition   Pulmonary shortness of breath and with exertion, sleep apnea ,    Pulmonary exam normal        Cardiovascular Exercise Tolerance: Good hypertension, On Medications negative cardio ROS Normal cardiovascular exam Rhythm:regular Rate:Normal     Neuro/Psych negative neurological ROS  negative psych ROS   GI/Hepatic Neg liver ROS, GERD  Medicated,  Endo/Other  negative endocrine ROSdiabetes, Well Controlled  Renal/GU negative Renal ROS  negative genitourinary   Musculoskeletal   Abdominal   Peds  Hematology negative hematology ROS (+)   Anesthesia Other Findings Past Medical History: No date: Compression fracture of L1 lumbar vertebra (HCC) No date: Diabetes mellitus without complication (HCC)     Comment:  Type II No date: Dysphagia, unspecified(787.20) No date: GERD (gastroesophageal reflux disease) No date: Hypertension No date: Parkinson's disease No date: Prostatitis No date: Sleep apnea     Comment:  CPAP No date: Syncope and collapse No date: Wears hearing aid in both ears Past Surgical History: 01/25/2021: BROW LIFT; Bilateral     Comment:  Procedure: BLEPHAROPLASTY UPPER EYELID; W/EXCESS SKIN               BROW PTOSIS REPAIR BILATERAL DIABETIC;  Surgeon: Karle Starch, MD;  Location: [facility];  Service:               Ophthalmology;  Laterality: Bilateral;  Diabetic - oral               meds 05/13/2004: CARDIAC CATHETERIZATION     Comment:  [facility] No date: COLONOSCOPY No date: ESOPHAGOGASTRODUODENOSCOPY   Reproductive/Obstetrics negative OB ROS                              Anesthesia Physical Anesthesia Plan  ASA: 3  Anesthesia Plan: General   Post-op Pain Management:    Induction:   PONV Risk Score and Plan:   Airway Management Planned:   Additional Equipment:   Intra-op Plan:   Post-operative Plan:   Informed Consent: I have reviewed the patients History and Physical, chart, labs and discussed the procedure including the risks, benefits and alternatives for the proposed anesthesia with the patient or authorized representative who has indicated his/her understanding and acceptance.     Dental Advisory Given  Plan Discussed with: CRNA  Anesthesia Plan Comments:         Anesthesia Quick Evaluation

## 2022-07-30 NOTE — Transfer of Care (Signed)
Immediate Anesthesia Transfer of Care Note  Patient: RILLEY STASH  Procedure(s) Performed: COLONOSCOPY ESOPHAGOGASTRODUODENOSCOPY (EGD)  Patient Location: PACU  Anesthesia Type:General  Level of Consciousness: sedated  Airway & Oxygen Therapy: Patient Spontanous Breathing  Post-op Assessment: Report given to RN and Post -op Vital signs reviewed and stable  Post vital signs: Reviewed and stable  Last Vitals:  Vitals Value Taken Time  BP 104/47 07/30/22 1124  Temp 36.2 C 07/30/22 1123  Pulse 63 07/30/22 1126  Resp 14 07/30/22 1126  SpO2 98 % 07/30/22 1126  Vitals shown include unvalidated device data.  Last Pain:  Vitals:   07/30/22 1123  TempSrc: Tympanic  PainSc: 0-No pain         Complications: No notable events documented.

## 2022-07-30 NOTE — Interval H&P Note (Signed)
History and Physical Interval Note:  07/30/2022 10:43 AM  John Mccall  has presented today for surgery, with the diagnosis of Abdominal pain, LUQ (left upper quadrant) (R10.12) Abdominal bloating (R14.0) Colon cancer screening (Z12.11).  The various methods of treatment have been discussed with the patient and family. After consideration of risks, benefits and other options for treatment, the patient has consented to  Procedure(s) with comments: COLONOSCOPY (N/A) - DM ESOPHAGOGASTRODUODENOSCOPY (EGD) (N/A) as a surgical intervention.  The patient's history has been reviewed, patient examined, no change in status, stable for surgery.  I have reviewed the patient's chart and labs.  Questions were answered to the patient's satisfaction.     Rena Lara, [place]

## 2022-07-30 NOTE — Op Note (Signed)
[facility] Gastroenterology Patient Name: John Mccall Procedure Date: 07/30/2022 10:40 AM MRN: [id number] Account #: [id number] Date of Birth: September 12, 1956 Admit Type: Outpatient Age: 66 Room: [facility] ENDO ROOM 2 Gender: Male Note Status: Finalized Instrument Name: Park Meo [id number] Procedure:             Colonoscopy Indications:           Screening for colorectal malignant neoplasm Providers:             Lorie Apley K. Liliann File MD, MD Medicines:             Propofol per Anesthesia Complications:         No immediate complications. Procedure:             Pre-Anesthesia Assessment:                        - The risks and benefits of the procedure and the                         sedation options and risks were discussed with the                         patient. All questions were answered and informed                         consent was obtained.                        - Patient identification and proposed procedure were                         verified prior to the procedure by the nurse. The                         procedure was verified in the procedure room.                        - ASA Grade Assessment: III - A patient with severe                         systemic disease.                        - After reviewing the risks and benefits, the patient                         was deemed in satisfactory condition to undergo the                         procedure.                        After obtaining informed consent, the colonoscope was                         passed under direct vision. Throughout the procedure,                         the patient's blood pressure, pulse, and oxygen  saturations were monitored continuously. The                         Colonoscope was introduced through the anus and                         advanced to the the cecum, identified by appendiceal                         orifice and ileocecal valve. The colonoscopy was                          performed without difficulty. The patient tolerated                         the procedure well. The quality of the bowel                         preparation was adequate. The ileocecal valve,                         appendiceal orifice, and rectum were photographed. Findings:      The perianal and digital rectal examinations were normal. Pertinent       negatives include normal sphincter tone and no palpable rectal lesions.      Non-bleeding internal hemorrhoids were found during retroflexion. The       hemorrhoids were Grade I (internal hemorrhoids that do not prolapse).      Many small and large-mouthed diverticula were found in the sigmoid colon.      A 6 mm polyp was found in the transverse colon. The polyp was sessile.       The polyp was removed with a piecemeal technique using a cold biopsy       forceps. Resection and retrieval were complete.      The exam was otherwise without abnormality. Impression:            - Non-bleeding internal hemorrhoids.                        - Diverticulosis in the sigmoid colon.                        - One 6 mm polyp in the transverse colon, removed                         piecemeal using a cold biopsy forceps. Resected and                         retrieved.                        - The examination was otherwise normal. Recommendation:        - Patient has a contact number available for                         emergencies. The signs and symptoms of potential                         delayed complications were discussed with the patient.  Return to normal activities tomorrow. Written                         discharge instructions were provided to the patient.                        - Resume previous diet.                        - Continue present medications.                        - Repeat colonoscopy is recommended for surveillance.                         The colonoscopy date will be determined after                          pathology results from today's exam become available                         for review.                        - Await pathology results from EGD, also performed                         today.                        - Return to GI office in 3 months.                        - Telephone GI office to schedule appointment.                        - The findings and recommendations were discussed with                         the patient. Procedure Code(s):     --- Professional ---                        [phone], Colonoscopy, flexible; with biopsy, single or                         multiple Diagnosis Code(s):     --- Professional ---                        K57.30, Diverticulosis of large intestine without                         perforation or abscess without bleeding                        K63.5, Polyp of colon                        K64.0, First degree hemorrhoids                        Z12.11, Encounter for screening for malignant neoplasm  of colon CPT copyright 2019 American Medical Association. All rights reserved. The codes documented in this report are preliminary and upon coder review may  be revised to meet current compliance requirements. [name] MD, MD 07/30/2022 11:23:57 AM This report has been signed electronically. Number of Addenda: 0 Note Initiated On: 07/30/2022 10:40 AM Scope Withdrawal Time: 0 hours 6 minutes 3 seconds  Total Procedure Duration: 0 hours 13 minutes 19 seconds  Estimated Blood Loss:  Estimated blood loss: none.      [facility]

## 2022-07-30 NOTE — H&P (Signed)
Outpatient short stay form Pre-procedure 07/30/2022 10:40 AM John Mccall K. Alice Reichert, M.D.  Primary Physician: Frazier Richards III, M.D.  Reason for visit:  Abdominal pain, screening colonoscopy  History of present illness:    John Mccall presents to the [facility] clinic as new patient to establish care for issues with 59-monthhistory of LUQ/flank abdominal pain. He presents to the clinic with his wife today who also helps with history. He reports 622-monthistory of non-progressive and nagging LUQ abdominal and flank pain. He reports associated bloating with the pain. No associated fevers, chills, nausea, vomiting, dysphagia, indigestion/GERD, hoarseness, epigastric abdominal pain, or change in bowel habits. He reports pain does not radiate. The pain seems to come on in the mornings after he drinks his 2 cups off coffee. In the mornings he typically takes 2 Vitamin D gummies on an empty stomach and then will drink 2 cups of coffee then 60-90 minutes later will eat breakfast which usually consists of cereal or peanut butter and jelly on toast. He does endorse history of mild constipation which has been present since his Parkinson's disease diagnosis. Parkinson's symptoms have been getting a little worse over the past 1-2 years, but things have been stable recently. Some of his Parkinson meds give him constipation. He takes 1/2 capful of Miralax daily for constipation and this generally works well to produce a daily bowel movement. He has no issues with hematochezia, melena, steatorrhea, fecal urgency, or fecal incontinence. He can have occasional loose stool depending on what he eats. He reports the LUQ abd pain does not seem to be correlated to having bowel movements. Last colonoscopy 01/2010 showed left-sided diverticulosis and otherwise normal examined colon.   CT Abd/Pelvis 03/27/22:  Colonic diverticulosis with no acute diverticulitis. Aortic Atherosclerosis.   Current Facility-Administered  Medications:    0.9 %  sodium chloride infusion, , Intravenous, Continuous, ToLos FresnosTeBenay PikeMD, Last Rate: 20 mL/hr at 07/30/22 1010, New Bag at 07/30/22 1010  Medications Prior to Admission  Medication Sig Dispense Refill Last Dose   carbidopa-levodopa (SINEMET CR) 50-200 MG tablet Take 1 tablet by mouth every morning.   07/30/2022   losartan (COZAAR) 50 MG tablet Take 50 mg by mouth daily.   Past Week   omeprazole (PRILOSEC) 20 MG capsule Take 1 capsule (20 mg total) by mouth 2 (two) times daily. 60 capsule 3 Past Week   rosuvastatin (CRESTOR) 5 MG tablet Take 5 mg by mouth daily.   07/30/2022   ALPRAZolam (XANAX) 0.25 MG tablet Take 0.5 mg by mouth at bedtime as needed for sleep.      amantadine (SYMMETREL) 100 MG capsule Take 100 mg by mouth 2 (two) times daily. (Patient not taking: Reported on 07/17/2021)      carbidopa-levodopa (SINEMET IR) 25-100 MG tablet Take 2 tablets by mouth 3 (three) times daily with meals.      erythromycin ophthalmic ointment Apply to sutures 4 times a day for 10-12 days.  Discontinue if allergy develops and call our office (Patient not taking: Reported on 07/17/2021) 3.5 g 2    escitalopram (LEXAPRO) 10 MG tablet Take 10 mg by mouth daily.      meloxicam (MOBIC) 15 MG tablet Take 15 mg by mouth daily.      metFORMIN (GLUCOPHAGE) 500 MG tablet Take 1,000 mg by mouth 2 (two) times daily with a meal.   07/28/2022   sitaGLIPtin (JANUVIA) 100 MG tablet Take 100 mg by mouth daily.   07/28/2022   traMADol (ULTRAM) 50  MG tablet Take 1 every 4-6 hours as needed for pain not controlled by Tylenol (Patient not taking: Reported on 07/17/2021) 6 tablet 0      Allergies  Allergen Reactions   Levaquin [Levofloxacin In D5w]     Tendon soreness     Past Medical History:  Diagnosis Date   Compression fracture of L1 lumbar vertebra (HCC)    Diabetes mellitus without complication (HCC)    Type II   Dysphagia, unspecified(787.20)    GERD (gastroesophageal reflux disease)     Hypertension    Parkinson's disease    Prostatitis    Sleep apnea    CPAP   Syncope and collapse    Wears hearing aid in both ears     Review of systems:  Otherwise negative.    Physical Exam  Gen: Alert, oriented. Appears stated age.  HEENT: [place]/AT. PERRLA. Lungs: CTA, no wheezes. CV: RR nl S1, S2. Abd: soft, benign, no masses. BS+ Ext: No edema. Pulses 2+    Planned procedures: Proceed with EGD and colonoscopy. The patient understands the nature of the planned procedure, indications, risks, alternatives and potential complications including but not limited to bleeding, infection, perforation, damage to internal organs and possible oversedation/side effects from anesthesia. The patient agrees and gives consent to proceed.  Please refer to procedure notes for findings, recommendations and patient disposition/instructions.     John Mccall K. Alice Reichert, M.D. Gastroenterology 07/30/2022  10:40 AM

## 2022-07-31 ENCOUNTER — Encounter: Payer: Self-pay | Admitting: Internal Medicine

## 2022-07-31 NOTE — Anesthesia Postprocedure Evaluation (Signed)
Anesthesia Post Note  Patient: QUAY SIMKIN  Procedure(s) Performed: COLONOSCOPY ESOPHAGOGASTRODUODENOSCOPY (EGD)  Patient location during evaluation: PACU Anesthesia Type: General Level of consciousness: awake and alert Pain management: pain level controlled Vital Signs Assessment: post-procedure vital signs reviewed and stable Respiratory status: spontaneous breathing, nonlabored ventilation, respiratory function stable and patient connected to nasal cannula oxygen Cardiovascular status: blood pressure returned to baseline and stable Postop Assessment: no apparent nausea or vomiting Anesthetic complications: no   No notable events documented.   Last Vitals:  Vitals:   07/30/22 1135 07/30/22 1145  BP: (!) 105/52 125/74  Pulse: 66   Resp: 18 (!) 23  Temp:    SpO2: 100%     Last Pain:  Vitals:   07/30/22 1145  TempSrc:   PainSc: 0-No pain                 Molli Barrows

## 2022-09-15 LAB — SURGICAL PATHOLOGY

## 2022-12-04 ENCOUNTER — Other Ambulatory Visit: Payer: Self-pay

## 2022-12-04 ENCOUNTER — Ambulatory Visit: Payer: Medicare PPO | Attending: Student

## 2022-12-04 DIAGNOSIS — M6281 Muscle weakness (generalized): Secondary | ICD-10-CM | POA: Insufficient documentation

## 2022-12-04 DIAGNOSIS — R2681 Unsteadiness on feet: Secondary | ICD-10-CM | POA: Insufficient documentation

## 2022-12-04 DIAGNOSIS — R2689 Other abnormalities of gait and mobility: Secondary | ICD-10-CM | POA: Insufficient documentation

## 2022-12-04 DIAGNOSIS — R293 Abnormal posture: Secondary | ICD-10-CM | POA: Insufficient documentation

## 2022-12-04 NOTE — Therapy (Signed)
OUTPATIENT PHYSICAL THERAPY NEURO EVALUATION only   Patient Name: John Mccall MRN: [id number] DOB:Mar 13, 1956, 67 y.o., male Today's Date: 12/04/2022   PCP: Kirk Ruths, MD REFERRING PROVIDER: Kerry Dory, PA-C  END OF SESSION:  PT End of Session - 12/04/22 0947     Visit Number 1    Authorization Type [employer]    Authorization Time Period Auth required    PT Start Time 0950    PT Stop Time 1100    PT Time Calculation (min) 70 min             Past Medical History:  Diagnosis Date   Compression fracture of L1 lumbar vertebra ([place])    Diabetes mellitus without complication ([place])    Type II   Dysphagia, unspecified(787.20)    GERD (gastroesophageal reflux disease)    Hypertension    Parkinson's disease    Prostatitis    Sleep apnea    CPAP   Syncope and collapse    Wears hearing aid in both ears    Past Surgical History:  Procedure Laterality Date   BROW LIFT Bilateral 01/25/2021   Procedure: BLEPHAROPLASTY UPPER EYELID; W/EXCESS SKIN BROW PTOSIS REPAIR BILATERAL DIABETIC;  Surgeon: Karle Starch, MD;  Location: [place];  Service: Ophthalmology;  Laterality: Bilateral;  Diabetic - oral meds   CARDIAC CATHETERIZATION  05/13/2004   [facility]   COLONOSCOPY     COLONOSCOPY N/A 07/30/2022   Procedure: COLONOSCOPY;  Surgeon: Toledo, Benay Pike, MD;  Location: [facility];  Service: Gastroenterology;  Laterality: N/A;  DM   ESOPHAGOGASTRODUODENOSCOPY     ESOPHAGOGASTRODUODENOSCOPY N/A 07/30/2022   Procedure: ESOPHAGOGASTRODUODENOSCOPY (EGD);  Surgeon: Toledo, Benay Pike, MD;  Location: [facility];  Service: Gastroenterology;  Laterality: N/A;   Patient Active Problem List   Diagnosis Date Noted   Palpitations 02/13/2014   Dyspnea 02/13/2014    ONSET DATE: Dx in 2018  REFERRING DIAG: G20.A1 (ICD-10-CM) - Parkinson's disease  THERAPY DIAG:  Other abnormalities of gait and mobility  Abnormal posture  Muscle weakness  (generalized)  Unsteadiness on feet  Rationale for Evaluation and Treatment: Rehabilitation  SUBJECTIVE:                                                                                                                                                                                             SUBJECTIVE STATEMENT: Pt with dx of PD in 2018 presents with right hand tremor at rest and intention and notes RLE also affected. Pt notes that his walking is still fluid and notes minimal difficulty in mobility. Pt has remote history of lumbar compression fracture  which do not cause any issues.  Patient is right hand dominant. Pt is retired and for hobbies enjoys gardening. Pt reports he does not exercise regularly or participate in classes and reports no desire to start. Reports walking on nature trails for activity at present and working on maintaining safety with uneven surfaces.   Pt accompanied by: self  PERTINENT HISTORY: Parkinson's disease, diagnosed in 2018, in patient with resting hand tremor (right > left), decreased speech volume, loss of sense of smell and tate, decreased arm swing, anxiety, dyskinesia, hunched forward posture, mild cog wheeling  PAIN:  Are you having pain? No  PRECAUTIONS: None  WEIGHT BEARING RESTRICTIONS: No  FALLS: Has patient fallen in last 6 months? No  LIVING ENVIRONMENT: Lives with: lives with their spouse Lives in: House/apartment Stairs: No, porch to enter, garage with ramp entrance Has following equipment at home: None  PLOF: Independent  PATIENT GOALS: "Not sure why I'm here, just to check me out and see how I'm doing"  OBJECTIVE:   DIAGNOSTIC FINDINGS: none lately  COGNITION: Overall cognitive status:  short term recall 2/3 recall, 1/3 with prompt   SENSATION:   COORDINATION: Slight impairment with RLE heel to shin Some dysdiadochokinesia   EDEMA:    MUSCLE TONE: some cogwheeling in RUE, no tone change in RLE perceived  MUSCLE  LENGTH: WNL, full knee extension, no flexion contracture  DTRs:    POSTURE: rounded shoulders, forward head, and increased thoracic kyphosis  LOWER EXTREMITY ROM:     WFL  LOWER EXTREMITY MMT:    5/5 BLE  BED MOBILITY:  indep  TRANSFERS: Indep  RAMP:  indep  CURB:  Indep  STAIRS: Indep  GAIT: Gait pattern: WFL Distance walked:  Assistive device utilized: None Level of assistance: Complete Independence Comments: maintains speed for 2.5-3.0 mph  FUNCTIONAL TESTS:  5 times sit to stand: 9 sec Timed up and go (TUG): 9.75 sec Mini-BESTest: 25/28  PATIENT SURVEYS:    TODAY'S TREATMENT:                                                                                                                              DATE: evaluation    PATIENT EDUCATION: Education details: assessment findings, discussed performing gait on level surfaces to perform sustained time/distance for cardiovascular training zone as well as benefit of motor coordination benefits Person educated: Patient Education method: Explanation Education comprehension: verbalized understanding  HOME EXERCISE PROGRAM: N/A  GOALS: Goals reviewed with patient? No, N/A at this time   ASSESSMENT:  CLINICAL IMPRESSION: Patient is a 67 y.o. male who was seen today for physical therapy evaluation and treatment for history of Parkinson's Disease. Thankfully, demonstrates little to no functional deficits or limitations and exhibits low risk for falls per outcome measures.  Pt maintains a physically active lifestyle and has resumed a walking routine now that the weather has improved.  Encouraged participation in community-based programs which pt declines interest in at  this time. Also encouraged an intentional, moderate-intensity walking program to capture 150 min/week goal for added benefit. No PT services indicated at this time.   OBJECTIVE IMPAIRMENTS: some deficits with rapid change of direction during  gait  ACTIVITY LIMITATIONS:  none at this time  PARTICIPATION LIMITATIONS:  none at this time  PERSONAL FACTORS: Age and Time since onset of injury/illness/exacerbation are also affecting patient's functional outcome.   REHAB POTENTIAL: N/A  CLINICAL DECISION MAKING: Stable/uncomplicated  EVALUATION COMPLEXITY: Low  PLAN:  PT FREQUENCY: one time visit  PT DURATION:   PLANNED INTERVENTIONS: Evaluation, low-complexity  PLAN FOR NEXT SESSION: n/a   10:43 AM, 12/04/22 M. Sherlyn Lees, PT, DPT Physical Therapist- [place] Office Number: [phone]

## 2023-01-22 ENCOUNTER — Other Ambulatory Visit: Payer: Self-pay

## 2023-01-22 ENCOUNTER — Emergency Department
Admission: EM | Admit: 2023-01-22 | Discharge: 2023-01-22 | Disposition: A | Discharge status: Home / Self Care | Payer: Medicare PPO | Attending: Emergency Medicine | Admitting: Emergency Medicine

## 2023-01-22 ENCOUNTER — Emergency Department: Payer: Medicare PPO

## 2023-01-22 DIAGNOSIS — I10 Essential (primary) hypertension: Secondary | ICD-10-CM | POA: Insufficient documentation

## 2023-01-22 DIAGNOSIS — E119 Type 2 diabetes mellitus without complications: Secondary | ICD-10-CM | POA: Diagnosis not present

## 2023-01-22 DIAGNOSIS — G20C Parkinsonism, unspecified: Secondary | ICD-10-CM | POA: Insufficient documentation

## 2023-01-22 DIAGNOSIS — R0789 Other chest pain: Secondary | ICD-10-CM | POA: Insufficient documentation

## 2023-01-22 LAB — CBC
HCT: 39.5 % (ref 39.0–52.0)
Hemoglobin: 13.2 g/dL (ref 13.0–17.0)
MCH: 29.3 pg (ref 26.0–34.0)
MCHC: 33.4 g/dL (ref 30.0–36.0)
MCV: 87.6 fL (ref 80.0–100.0)
Platelets: 184 K/uL (ref 150–400)
RBC: 4.51 MIL/uL (ref 4.22–5.81)
RDW: 12.3 % (ref 11.5–15.5)
WBC: 6.9 K/uL (ref 4.0–10.5)
nRBC: 0 % (ref 0.0–0.2)

## 2023-01-22 LAB — BASIC METABOLIC PANEL WITH GFR
Anion gap: 9 (ref 5–15)
BUN: 19 mg/dL (ref 8–23)
CO2: 24 mmol/L (ref 22–32)
Calcium: 9 mg/dL (ref 8.9–10.3)
Chloride: 103 mmol/L (ref 98–111)
Creatinine, Ser: 0.88 mg/dL (ref 0.61–1.24)
GFR, Estimated: >60 mL/min
Glucose, Bld: 98 mg/dL (ref 70–99)
Potassium: 4.2 mmol/L (ref 3.5–5.1)
Sodium: 136 mmol/L (ref 135–145)

## 2023-01-22 LAB — TROPONIN I (HIGH SENSITIVITY)
Troponin I (High Sensitivity): <2 ng/L (ref <18)
Troponin I (High Sensitivity): <2 ng/L (ref <18)

## 2023-01-22 NOTE — Discharge Instructions (Signed)
Continue taking your omeprazole and other medications as prescribed.  You may try taking Pepcid (famotidine) up to twice daily on top of this especially after eating.  Follow-up with a cardiologist; we have provided referral information for Dr. Kirke Corin.  Return to the ER for new, worsening, or persistent severe chest discomfort, difficulty breathing, weakness or lightheadedness, or any other new or worsening symptoms that concern you.

## 2023-01-22 NOTE — ED Provider Notes (Signed)
[facility] Provider Note    Event Date/Time   First MD Initiated Contact with Patient 01/22/23 1517     (approximate)   History   Chest Pain   HPI  John Mccall is a 67 y.o. male with a history of Parkinson's disease, GERD, diabetes, hypertension, and OSA on CPAP who presents with chest discomfort for the last 3 days, described as tightness, mainly in the center of his chest, and intermittent.  It is not exertional.  The patient denies any associated shortness of breath or nausea.  He did feel slightly lightheaded today but this has improved.  He states that he takes omeprazole for GERD and this has not helped.  The patient states that he had a cardiac cath about 20 years ago but has not had any cardiac problems or more recent workup since that time.  I reviewed the past medical records.  The patient's most recent outpatient encounter was 1 2/5 with Dr. Sherryll Burger from neurology for follow-up of his Parkinson's.  He has no recent ED visits or admissions.   Physical Exam   Triage Vital Signs: ED Triage Vitals  Enc Vitals Group     BP 01/22/23 1315 102/80     Pulse Rate 01/22/23 1315 70     Resp 01/22/23 1315 17     Temp 01/22/23 1315 98.4 F (36.9 C)     Temp Source 01/22/23 1315 Oral     SpO2 01/22/23 1315 96 %     Weight 01/22/23 1312 211 lb (95.7 kg)     Height 01/22/23 1312 5\' 10"  (1.778 m)     Head Circumference --      Peak Flow --      Pain Score 01/22/23 1311 3     Pain Loc --      Pain Edu? --      Excl. in GC? --     Most recent vital signs: Vitals:   01/22/23 1315  BP: 102/80  Pulse: 70  Resp: 17  Temp: 98.4 F (36.9 C)  SpO2: 96%     General: Awake, no distress.  CV:  Good peripheral perfusion.  Normal heart sounds. Resp:  Normal effort.  Lungs CTAB. Abd:  No distention.  Other:  No peripheral edema.   ED Results / Procedures / Treatments   Labs (all labs ordered are listed, but only abnormal results are  displayed) Labs Reviewed  BASIC METABOLIC PANEL  CBC  TROPONIN I (HIGH SENSITIVITY)  TROPONIN I (HIGH SENSITIVITY)     EKG  ED ECG REPORT I, Dionne Bucy, the attending physician, personally viewed and interpreted this ECG.  Date: 01/22/2023 EKG Time: 1310 Rate: 70 Rhythm: normal sinus rhythm QRS Axis: normal Intervals: normal ST/T Wave abnormalities: normal Narrative Interpretation: no evidence of acute ischemia    RADIOLOGY  Chest x-ray: I independently viewed and interpreted the images; there is no focal consolidation or edema   PROCEDURES:  Critical Care performed: No  Procedures   MEDICATIONS ORDERED IN ED: Medications - No data to display   IMPRESSION / MDM / ASSESSMENT AND PLAN / ED COURSE  I reviewed the triage vital signs and the nursing notes.  67 year old male with no prior cardiac history presents with intermittent chest pain over the last 3 days.  EKG is nonischemic.  Differential diagnosis includes, but is not limited to, musculoskeletal pain, GERD, other benign etiology.  I have a lower suspicion for ACS.  There is no clinical evidence  of PE given the lack of tachycardia, hypoxia, and the intermittent nature of the pain.  There is also no clinical evidence for aortic dissection or other vascular cause.  We will obtain troponins x 2 and reassess.  Patient's presentation is most consistent with acute presentation with potential threat to life or bodily function.  The patient is on the cardiac monitor to evaluate for evidence of arrhythmia and/or significant heart rate changes.   ----------------------------------------- 4:47 PM on 01/22/2023 -----------------------------------------  Troponins are negative x 2.  Chest x-ray shows no acute findings.  BMP and CBC are normal as well.  The patient is not having any active chest pain at this time.  He is stable for discharge home.  I have referred him for outpatient cardiology follow-up; he would  like to follow-up with Dr. Kirke Corin who his wife also sees.  I gave strict return precautions and he expressed understanding.  FINAL CLINICAL IMPRESSION(S) / ED DIAGNOSES   Final diagnoses:  Atypical chest pain     Rx / DC Orders   ED Discharge Orders     None        Note:  This document was prepared using Dragon voice recognition software and may include unintentional dictation errors.    Dionne Bucy, MD 01/22/23 [phone]

## 2023-01-22 NOTE — ED Triage Notes (Signed)
Arrives from [facility] c/o chest pain and dizziness since Tuesday afternoon.  Pain intermittent.    AAOx3.  Skin warm and dry. NAD

## 2023-01-22 NOTE — ED Triage Notes (Signed)
Pt to ED for 3/10 "nagging" chest pressure since 2 days. Some dizziness since this morning. Ambulatory with steady gait. Denies SOB. Well appearing, NAD. Skin dry.
